# Patient Record
Sex: Female | Born: 1975
Health system: Southern US, Community
[De-identification: ages and names within clinical notes are randomized; demographics above are authoritative.]

## PROBLEM LIST (undated history)

## (undated) DIAGNOSIS — L811 Chloasma: Secondary | ICD-10-CM

## (undated) DIAGNOSIS — N3941 Urge incontinence: Secondary | ICD-10-CM

## (undated) DIAGNOSIS — Z803 Family history of malignant neoplasm of breast: Secondary | ICD-10-CM

## (undated) DIAGNOSIS — Z8041 Family history of malignant neoplasm of ovary: Secondary | ICD-10-CM

## (undated) DIAGNOSIS — K219 Gastro-esophageal reflux disease without esophagitis: Secondary | ICD-10-CM

## (undated) DIAGNOSIS — Z1371 Encounter for nonprocreative screening for genetic disease carrier status: Secondary | ICD-10-CM

## (undated) DIAGNOSIS — F329 Major depressive disorder, single episode, unspecified: Secondary | ICD-10-CM

## (undated) DIAGNOSIS — Z9289 Personal history of other medical treatment: Secondary | ICD-10-CM

## (undated) DIAGNOSIS — F32A Depression, unspecified: Secondary | ICD-10-CM

## (undated) HISTORY — DX: Family history of malignant neoplasm of ovary: Z80.41

## (undated) HISTORY — DX: Gastro-esophageal reflux disease without esophagitis: K21.9

## (undated) HISTORY — DX: Personal history of other medical treatment: Z92.89

## (undated) HISTORY — DX: Chloasma: L81.1

## (undated) HISTORY — DX: Urge incontinence: N39.41

## (undated) HISTORY — PX: CHOLECYSTECTOMY: SHX55

## (undated) HISTORY — DX: Depression, unspecified: F32.A

## (undated) HISTORY — DX: Encounter for nonprocreative screening for genetic disease carrier status: Z13.71

## (undated) HISTORY — DX: Family history of malignant neoplasm of breast: Z80.3

## (undated) HISTORY — DX: Major depressive disorder, single episode, unspecified: F32.9

---

## 1999-01-20 ENCOUNTER — Other Ambulatory Visit: Admission: RE | Admit: 1999-01-20 | Discharge: 1999-01-20 | Payer: Self-pay | Admitting: Obstetrics and Gynecology

## 2003-01-20 HISTORY — PX: BREAST BIOPSY: SHX20

## 2003-10-26 ENCOUNTER — Ambulatory Visit: Payer: Self-pay | Admitting: Surgery

## 2004-05-21 ENCOUNTER — Ambulatory Visit: Payer: Self-pay

## 2004-09-01 ENCOUNTER — Ambulatory Visit: Payer: Self-pay | Admitting: Obstetrics and Gynecology

## 2004-09-08 ENCOUNTER — Ambulatory Visit: Payer: Self-pay

## 2005-03-25 ENCOUNTER — Inpatient Hospital Stay: Payer: Self-pay | Admitting: Certified Nurse Midwife

## 2007-06-30 ENCOUNTER — Ambulatory Visit: Payer: Self-pay

## 2007-07-13 ENCOUNTER — Ambulatory Visit: Payer: Self-pay

## 2008-11-05 ENCOUNTER — Ambulatory Visit: Payer: Self-pay

## 2009-12-25 ENCOUNTER — Ambulatory Visit: Payer: Self-pay

## 2010-02-19 ENCOUNTER — Ambulatory Visit: Payer: Self-pay | Admitting: Family Medicine

## 2010-02-20 ENCOUNTER — Ambulatory Visit: Payer: Self-pay | Admitting: Family Medicine

## 2010-03-21 ENCOUNTER — Ambulatory Visit: Payer: Self-pay | Admitting: Surgery

## 2010-03-21 ENCOUNTER — Other Ambulatory Visit: Payer: Self-pay | Admitting: Surgery

## 2010-03-24 LAB — PATHOLOGY REPORT

## 2010-04-14 ENCOUNTER — Ambulatory Visit: Payer: Self-pay | Admitting: Surgery

## 2010-04-29 ENCOUNTER — Ambulatory Visit: Payer: Self-pay | Admitting: Surgery

## 2010-04-30 LAB — PATHOLOGY REPORT

## 2010-08-18 ENCOUNTER — Ambulatory Visit: Payer: Self-pay

## 2010-12-10 ENCOUNTER — Other Ambulatory Visit: Payer: Self-pay | Admitting: Physician Assistant

## 2011-04-24 ENCOUNTER — Ambulatory Visit: Payer: Self-pay

## 2011-05-12 ENCOUNTER — Other Ambulatory Visit: Payer: Self-pay | Admitting: Physician Assistant

## 2011-05-12 ENCOUNTER — Other Ambulatory Visit: Payer: Self-pay | Admitting: Obstetrics and Gynecology

## 2011-05-12 LAB — CBC WITH DIFFERENTIAL/PLATELET
Basophil %: 0.6 %
Eosinophil #: 0 10*3/uL (ref 0.0–0.7)
Eosinophil %: 0.5 %
HCT: 41.2 % (ref 35.0–47.0)
Lymphocyte #: 1.4 10*3/uL (ref 1.0–3.6)
Lymphocyte %: 30.1 %
MCHC: 33.9 g/dL (ref 32.0–36.0)
MCV: 88 fL (ref 80–100)
Monocyte #: 0.3 x10 3/mm (ref 0.2–0.9)
Monocyte %: 6.8 %
Neutrophil #: 2.9 10*3/uL (ref 1.4–6.5)
Neutrophil %: 62 %
Platelet: 206 10*3/uL (ref 150–440)
RBC: 4.7 10*6/uL (ref 3.80–5.20)
WBC: 4.7 10*3/uL (ref 3.6–11.0)

## 2011-05-12 LAB — TSH: Thyroid Stimulating Horm: 0.871 u[IU]/mL

## 2011-05-12 LAB — BASIC METABOLIC PANEL
Anion Gap: 7 (ref 7–16)
EGFR (Non-African Amer.): >60
Osmolality: 278 (ref 275–301)
Potassium: 3.6 mmol/L (ref 3.5–5.1)

## 2015-10-11 DIAGNOSIS — E559 Vitamin D deficiency, unspecified: Secondary | ICD-10-CM | POA: Diagnosis not present

## 2015-10-11 DIAGNOSIS — Z803 Family history of malignant neoplasm of breast: Secondary | ICD-10-CM | POA: Diagnosis not present

## 2015-10-11 DIAGNOSIS — R002 Palpitations: Secondary | ICD-10-CM | POA: Diagnosis not present

## 2015-10-11 DIAGNOSIS — R140 Abdominal distension (gaseous): Secondary | ICD-10-CM | POA: Diagnosis not present

## 2015-10-11 DIAGNOSIS — Z1211 Encounter for screening for malignant neoplasm of colon: Secondary | ICD-10-CM | POA: Diagnosis not present

## 2015-10-11 DIAGNOSIS — Z8041 Family history of malignant neoplasm of ovary: Secondary | ICD-10-CM | POA: Diagnosis not present

## 2015-10-11 DIAGNOSIS — Z01419 Encounter for gynecological examination (general) (routine) without abnormal findings: Secondary | ICD-10-CM | POA: Diagnosis not present

## 2015-10-11 DIAGNOSIS — Z124 Encounter for screening for malignant neoplasm of cervix: Secondary | ICD-10-CM | POA: Diagnosis not present

## 2015-10-15 ENCOUNTER — Other Ambulatory Visit: Payer: Self-pay | Admitting: Certified Nurse Midwife

## 2015-10-15 DIAGNOSIS — Z1231 Encounter for screening mammogram for malignant neoplasm of breast: Secondary | ICD-10-CM

## 2015-10-22 ENCOUNTER — Encounter: Payer: Self-pay | Admitting: Radiology

## 2015-10-22 ENCOUNTER — Ambulatory Visit
Admission: RE | Admit: 2015-10-22 | Discharge: 2015-10-22 | Disposition: A | Discharge status: Home / Self Care | Payer: 59 | Source: Ambulatory Visit | Attending: Certified Nurse Midwife | Admitting: Certified Nurse Midwife

## 2015-10-22 DIAGNOSIS — Z9289 Personal history of other medical treatment: Secondary | ICD-10-CM

## 2015-10-22 DIAGNOSIS — Z1231 Encounter for screening mammogram for malignant neoplasm of breast: Secondary | ICD-10-CM | POA: Diagnosis not present

## 2015-10-22 HISTORY — DX: Personal history of other medical treatment: Z92.89

## 2015-10-24 DIAGNOSIS — M542 Cervicalgia: Secondary | ICD-10-CM | POA: Diagnosis not present

## 2015-10-24 DIAGNOSIS — M546 Pain in thoracic spine: Secondary | ICD-10-CM | POA: Diagnosis not present

## 2015-10-24 DIAGNOSIS — M62830 Muscle spasm of back: Secondary | ICD-10-CM | POA: Diagnosis not present

## 2015-12-05 DIAGNOSIS — H5213 Myopia, bilateral: Secondary | ICD-10-CM | POA: Diagnosis not present

## 2016-10-22 ENCOUNTER — Encounter: Payer: Self-pay | Admitting: Certified Nurse Midwife

## 2016-10-22 ENCOUNTER — Ambulatory Visit (INDEPENDENT_AMBULATORY_CARE_PROVIDER_SITE_OTHER): Payer: 59 | Admitting: Certified Nurse Midwife

## 2016-10-22 VITALS — BP 110/68 | HR 59 | Ht 64.0 in | Wt 149.0 lb

## 2016-10-22 DIAGNOSIS — Z8041 Family history of malignant neoplasm of ovary: Secondary | ICD-10-CM

## 2016-10-22 DIAGNOSIS — Z803 Family history of malignant neoplasm of breast: Secondary | ICD-10-CM | POA: Diagnosis not present

## 2016-10-22 DIAGNOSIS — R61 Generalized hyperhidrosis: Secondary | ICD-10-CM

## 2016-10-22 DIAGNOSIS — R232 Flushing: Secondary | ICD-10-CM | POA: Diagnosis not present

## 2016-10-22 DIAGNOSIS — Z124 Encounter for screening for malignant neoplasm of cervix: Secondary | ICD-10-CM | POA: Diagnosis not present

## 2016-10-22 DIAGNOSIS — Z01419 Encounter for gynecological examination (general) (routine) without abnormal findings: Secondary | ICD-10-CM | POA: Diagnosis not present

## 2016-10-22 DIAGNOSIS — L293 Anogenital pruritus, unspecified: Secondary | ICD-10-CM

## 2016-10-22 DIAGNOSIS — N926 Irregular menstruation, unspecified: Secondary | ICD-10-CM

## 2016-10-22 DIAGNOSIS — Z1231 Encounter for screening mammogram for malignant neoplasm of breast: Secondary | ICD-10-CM

## 2016-10-22 DIAGNOSIS — Z13220 Encounter for screening for lipoid disorders: Secondary | ICD-10-CM

## 2016-10-22 DIAGNOSIS — Z1239 Encounter for other screening for malignant neoplasm of breast: Secondary | ICD-10-CM

## 2016-10-22 MED ORDER — HYDROCORTISONE ACE-PRAMOXINE 2.5-1 % RE CREA: 1.0000 "application " | TOPICAL_CREAM | Freq: Three times a day (TID) | RECTAL | 0 refills | Status: DC

## 2016-10-22 NOTE — Progress Notes (Signed)
Gynecology Annual Exam  PCP: Adron Bene, PA-C  Chief Complaint:  Chief Complaint  Patient presents with  . Gynecologic Exam    History of Present Illness:History Of Present Illness  Ashley Porter is a 41 year old Caucasian/White female , G 2 P 2 0 0 2 , who presents for her annual exam. She is having problems with irregular menses since Spring of this year. Her menses were regular (every 28-30 days) until Spring at which time they started to space out to 40+ days apart and then she had no menses in July and August. In September she had 2 menses 21 days apart.  Her LMP was 10/17/2016 . Her menses last 3 days , are medium flow, and are without clots. She has been having hot flashes and night sweats since summer, but they have decreased since losing weight. She denies dysmenorrhea with menses, but does have some Mittleschmerz, which does not usually require analgesics.  The patient's past medical history is detailed in the past medical history section.  Since her last annual GYN exam dated 10/11/2015, she has also had some problems with vulvovaginal and perirectal irritation. She was treated for a self diagnosed yeast infection in the spring and her vulvovaginal itching has decreased. She has some hemorrhoidal discomfort and has been using Prep H wipes with some relief. She reports recently having an episode of  upper abdominal pain accompanied by diarrhea that resolved after 1-2 days. Was not seen by a provider for evaluation.  She is sexually active. She is currently using a vasectomy for contraception.  Her most recent pap smear was obtained 10/11/2015 and was NIL/ negative HRHPV. Her most recent mammogram obtained on 10/22/2015 was normal and revealed no significant changes.  There is a positive history of breast cancer in her mother. Genetic testing has been done. Her mother and more recently her brother tested positive for BRCA 1 and Soma tested negative for this mutation Her  lifetime risk of breast cancer is 14.7% (TC Model 10/13/2015) There is a family history of ovarian cancer in her maternal great grandmother, a maternal second cousin. and the maternal second cousin's daughter. Genetic testing has been done on the second cousin's daughter which was positive. The patient did not have testing except for the deletion on BRCA1  The patient does do occasional self breast exams.  The patient does not smoke.  The patient does drink occasionally.  The patient does not use illegal drugs.  The patient exercises occasionally.  The patient does not get adequate calcium in her diet.  She had a recent cholesterol screen in 2016 that was normal.    The patient denies current symptoms of depression.    Review of Systems: Review of Systems  Constitutional: Positive for weight loss (intentional). Negative for chills and fever.  HENT: Negative for congestion, sinus pain and sore throat.   Eyes: Negative for blurred vision and pain.  Respiratory: Negative for hemoptysis, shortness of breath and wheezing.   Cardiovascular: Positive for palpitations. Negative for chest pain and leg swelling.  Gastrointestinal: Negative for abdominal pain, blood in stool, diarrhea, heartburn, nausea and vomiting.       Positive for rectal irritation/ hemorrhoids  Genitourinary: Negative for dysuria, frequency, hematuria and urgency.       Positive for irregular menses  Musculoskeletal: Negative for back pain, joint pain and myalgias.  Skin: Negative for itching and rash.  Neurological: Negative for dizziness, tingling and headaches.  Endo/Heme/Allergies: Negative for  environmental allergies and polydipsia. Does not bruise/bleed easily.       Negative for hirsutism; positive for hot flashes and night sweats   Psychiatric/Behavioral: Negative for depression. The patient is nervous/anxious. The patient does not have insomnia.     Past Medical History:  Past Medical History:  Diagnosis Date  .  BRCA negative   . Depression   . Family history of breast cancer    mom is BRCA positive and pt is BRCA negative  . Family history of ovarian cancer   . GERD (gastroesophageal reflux disease)   . History of mammogram 10/22/2015   Birads 1D had 3D mammo  . Melasma   . Urge incontinence     Past Surgical History:  Past Surgical History:  Procedure Laterality Date  . BREAST BIOPSY Right 2005   neg  . CHOLECYSTECTOMY      Family History:  Family History  Problem Relation Age of Onset  . Breast cancer Mother 52       has BRCA1 mutation  . Basal cell carcinoma Mother   . Hypertension Father   . Ovarian cancer Maternal Grandmother 18  . Alzheimer's disease Paternal Grandmother   . Ovarian cancer Cousin 23       maternal second cousin. Maternal second cousin's daughter with ovarian cancer- has BRCA1 mutation    Social History:  Social History   Social History  . Marital status: Married    Spouse name: N/A  . Number of children: 2  . Years of education: N/A   Occupational History  . 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 Smoking status: Never Smoker  . Smokeless tobacco: Never Used  . Alcohol use Yes     Comment: occasional  . Drug use: No  . Sexual activity: Yes    Partners: Male    Birth control/ protection: Other-see comments     Comment: vasectomy   Other Topics Concern  . Not on file   Social History Narrative  . No narrative on file    Allergies:  No Known Allergies  Medications: Current Outpatient Prescriptions:  .  Cholecalciferol (VITAMIN D3) 5000 units TBDP, Take by mouth., Disp: , Rfl:  .  Multiple Vitamin (MULTIVITAMIN) tablet, Take 1 tablet by mouth daily., Disp: , Rfl:  .  Probiotic Product (PROBIOTIC-10 PO), Take by mouth., Disp: , Rfl:  .  hydrocortisone-pramoxine (ANALPRAM HC) 2.5-1 % rectal cream, Place 1 application rectally 3 (three) times daily., Disp: 30 g, Rfl: 0   Physical Exam Vitals: BP 110/68   Pulse (!) 59   Ht  '5\' 4"'  (1.626 m)   Wt 149 lb (67.6 kg)   LMP 10/17/2016 (Exact Date)   BMI 25.58 kg/m   General: WF in  NAD HEENT: normocephalic, anicteric Neck: no thyroid enlargement, no palpable nodules, no cervical lymphadenopathy  Pulmonary: No increased work of breathing, CTAB Cardiovascular: RRR, without murmur  Breast: Breast symmetrical, no tenderness, no palpable nodules or masses, no skin or nipple retraction present, no nipple discharge.  No axillary, infraclavicular or supraclavicular lymphadenopathy. Abdomen: Soft, non-tender, non-distended.  Umbilicus without lesions.  No hepatomegaly or masses palpable. No evidence of hernia. Genitourinary:  External: Normal external female genitalia.  Normal urethral meatus, normal Bartholin's and Skene's glands.  Lower portion of perineum with inflammation and tiny fissures.  Vagina: Normal vaginal mucosa, no evidence of prolapse.    Cervix: Grossly normal in appearance, no bleeding, non-tender  Uterus: Retroflexed, normal size, shape, and  consistency, mobile, and non-tender  Adnexa: No adnexal masses, non-tender  Rectal: inflammation and tiny fissures in perianal mucosa at 12 o'clock; small non inflamed external hemorrhoids  Lymphatic: no evidence of inguinal lymphadenopathy Extremities: no edema, erythema, or tenderness Neurologic: Grossly intact Psychiatric: mood appropriate, affect full     Assessment: 41 y.o. G2 P2 with irregular menses/ hot flashes and night sweats. R/O perimenopausal symptoms R/O thyroid disease  FSH, LH, TSH, CBC, CMP Perirectal irritation and fissures  Trial of Analpram HC 2.5% tid topically Family history of breast and ovarian cancer   Plan:   1) Breast cancer screening - recommend monthly self breast exam and annual mammograms. Mammogram was ordered today. Patient to schedule at [facility]  2) Cervical cancer screening - Pap was done.  3) Routine healthcare maintenance including cholesterol screening ordered  today. Will notify of results.  4) RTO 1 year or sooner prn.   Dalia Heading, CNM

## 2016-10-23 ENCOUNTER — Encounter: Payer: Self-pay | Admitting: Certified Nurse Midwife

## 2016-10-23 DIAGNOSIS — Z8041 Family history of malignant neoplasm of ovary: Secondary | ICD-10-CM | POA: Insufficient documentation

## 2016-10-23 DIAGNOSIS — Z803 Family history of malignant neoplasm of breast: Secondary | ICD-10-CM | POA: Insufficient documentation

## 2016-10-23 LAB — IGP,RFX APTIMA HPV ALL PTH: PAP SMEAR COMMENT: 0

## 2016-10-23 LAB — CBC WITH DIFFERENTIAL/PLATELET
BASOS ABS: 0 10*3/uL (ref 0.0–0.2)
Basos: 0 %
EOS (ABSOLUTE): 0 10*3/uL (ref 0.0–0.4)
Eos: 1 %
HEMOGLOBIN: 14.3 g/dL (ref 11.1–15.9)
Hematocrit: 42 % (ref 34.0–46.6)
Immature Grans (Abs): 0 10*3/uL (ref 0.0–0.1)
Immature Granulocytes: 0 %
LYMPHS ABS: 1.2 10*3/uL (ref 0.7–3.1)
LYMPHS: 29 %
MCH: 30.2 pg (ref 26.6–33.0)
MCHC: 34 g/dL (ref 31.5–35.7)
MCV: 89 fL (ref 79–97)
MONOCYTES: 5 %
MONOS ABS: 0.2 10*3/uL (ref 0.1–0.9)
NEUTROS ABS: 2.7 10*3/uL (ref 1.4–7.0)
NEUTROS PCT: 65 %
PLATELETS: 214 10*3/uL (ref 150–379)
RBC: 4.74 x10E6/uL (ref 3.77–5.28)
RDW: 13.9 % (ref 12.3–15.4)
WBC: 4.2 10*3/uL (ref 3.4–10.8)

## 2016-10-23 LAB — POCT WET PREP (WET MOUNT): TRICHOMONAS WET PREP HPF POC: ABSENT

## 2016-10-23 LAB — COMPREHENSIVE METABOLIC PANEL
ALBUMIN: 4.4 g/dL (ref 3.5–5.5)
ALK PHOS: 55 IU/L (ref 39–117)
ALT: 14 IU/L (ref 0–32)
AST: 19 IU/L (ref 0–40)
Albumin/Globulin Ratio: 2.1 (ref 1.2–2.2)
BILIRUBIN TOTAL: 0.4 mg/dL (ref 0.0–1.2)
BUN / CREAT RATIO: 15 (ref 9–23)
BUN: 13 mg/dL (ref 6–24)
CHLORIDE: 101 mmol/L (ref 96–106)
CO2: 21 mmol/L (ref 20–29)
Calcium: 9.1 mg/dL (ref 8.7–10.2)
Creatinine, Ser: 0.85 mg/dL (ref 0.57–1.00)
GFR calc Af Amer: 99 mL/min/{1.73_m2} (ref >59)
GFR calc non Af Amer: 86 mL/min/{1.73_m2} (ref >59)
GLUCOSE: 79 mg/dL (ref 65–99)
Globulin, Total: 2.1 g/dL (ref 1.5–4.5)
Potassium: 4 mmol/L (ref 3.5–5.2)
SODIUM: 137 mmol/L (ref 134–144)
Total Protein: 6.5 g/dL (ref 6.0–8.5)

## 2016-10-23 LAB — LIPID PANEL
CHOLESTEROL TOTAL: 121 mg/dL (ref 100–199)
Chol/HDL Ratio: 2.4 ratio (ref 0.0–4.4)
HDL: 51 mg/dL (ref >39)
LDL Calculated: 50 mg/dL (ref 0–99)
Triglycerides: 102 mg/dL (ref 0–149)
VLDL CHOLESTEROL CAL: 20 mg/dL (ref 5–40)

## 2016-10-23 LAB — TSH: TSH: 2.04 u[IU]/mL (ref 0.450–4.500)

## 2016-10-23 LAB — FSH/LH
FSH: 13.6 m[IU]/mL
LH: 5.8 m[IU]/mL

## 2016-10-26 ENCOUNTER — Ambulatory Visit: Payer: Self-pay | Admitting: Certified Nurse Midwife

## 2016-10-29 ENCOUNTER — Ambulatory Visit: Payer: Self-pay | Admitting: Certified Nurse Midwife

## 2016-11-12 ENCOUNTER — Ambulatory Visit
Admission: RE | Admit: 2016-11-12 | Discharge: 2016-11-12 | Disposition: A | Discharge status: Home / Self Care | Payer: 59 | Source: Ambulatory Visit | Attending: Certified Nurse Midwife | Admitting: Certified Nurse Midwife

## 2016-11-12 DIAGNOSIS — Z1231 Encounter for screening mammogram for malignant neoplasm of breast: Secondary | ICD-10-CM | POA: Insufficient documentation

## 2016-11-12 DIAGNOSIS — Z1239 Encounter for other screening for malignant neoplasm of breast: Secondary | ICD-10-CM

## 2016-12-15 DIAGNOSIS — H5213 Myopia, bilateral: Secondary | ICD-10-CM | POA: Diagnosis not present

## 2017-06-01 ENCOUNTER — Ambulatory Visit (INDEPENDENT_AMBULATORY_CARE_PROVIDER_SITE_OTHER): Payer: 59 | Admitting: Certified Nurse Midwife

## 2017-06-01 ENCOUNTER — Encounter: Payer: Self-pay | Admitting: Certified Nurse Midwife

## 2017-06-01 VITALS — BP 108/68 | HR 60 | Ht 64.0 in | Wt 131.0 lb

## 2017-06-01 DIAGNOSIS — N6310 Unspecified lump in the right breast, unspecified quadrant: Secondary | ICD-10-CM | POA: Diagnosis not present

## 2017-06-01 NOTE — Progress Notes (Signed)
Obstetrics & Gynecology Office Visit   Chief Complaint:  Chief Complaint  Patient presents with  . Breast Problem    breast exam    History of Present Illness: Ashley Porter is a 42 year old female G2 P2002, LMP 05/27/2017, presents with complaints of a right breast mass that she first palpated 2-3 weeks ago. The mass did not resolve after her menses.  The mass is not tender. Her last mammogram was 11/12/2016 and was negative. There is a positive history of breast cancer in her mother. Genetic testing has been done. Her mother and more recently her brother tested positive for BRCA 1 and Monick tested negative for this mutation Her lifetime risk of breast cancer is 14.7% (TC Model 10/13/2015)   Review of Systems:  ROS -see HPI. ROS also remarkable for a 18# intentional weight loss since her annual exam.  Past Medical History:  Past Medical History:  Diagnosis Date  . BRCA negative   . Depression   . Family history of breast cancer    mom is BRCA positive and pt is BRCA negative  . Family history of ovarian cancer   . GERD (gastroesophageal reflux disease)   . History of mammogram 10/22/2015   Birads 1D had 3D mammo  . Melasma   . Urge incontinence     Past Surgical History:  Past Surgical History:  Procedure Laterality Date  . BREAST BIOPSY Right 2005   neg  . CHOLECYSTECTOMY      Gynecologic History: Patient's last menstrual period was 05/27/2017 (exact date).  Obstetric History: [id number]  Family History:  Family History  Problem Relation Age of Onset  . Breast cancer Mother 42       has BRCA1 mutation  . Basal cell carcinoma Mother   . Hypertension Father   . Ovarian cancer Maternal Grandmother 69  . Alzheimer's disease Paternal Grandmother   . Ovarian cancer Cousin 73       maternal second cousin. Maternal second cousin's daughter with ovarian cancer- has BRCA1 mutation    Social History:  Social History   Socioeconomic History  . Marital status: Married    Spouse name: Not on file  . Number of children: 2  . Years of education: Not on file  . Highest education level: Not on file  Occupational History  . Occupation: RN-NICU at [employer]  . Financial resource strain: Not on file  . Food insecurity:    Worry: Not on file    Inability: Not on file  . Transportation needs:    Medical: Not on file    Non-medical: Not on file  Tobacco Use  . Smoking status: Never Smoker  . Smokeless tobacco: Never Used  Substance and Sexual Activity  . Alcohol use: Yes    Comment: occasional  . Drug use: No  . Sexual activity: Yes    Partners: Male    Birth control/protection: Other-see comments    Comment: vasectomy  Lifestyle  . Physical activity:    Days per week: Not on file    Minutes per session: Not on file  . Stress: Not on file  Relationships  . Social connections:    Talks on phone: Not on file    Gets together: Not on file    Attends religious service: Not on file    Active member of club or organization: Not on file    Attends meetings of clubs or organizations: Not on file    Relationship status:  Not on file  . Intimate partner violence:    Fear of current or ex partner: Not on file    Emotionally abused: Not on file    Physically abused: Not on file    Forced sexual activity: Not on file  Other Topics Concern  . Not on file  Social History Narrative  . Not on file    Allergies:  No Known Allergies  Medications: Prior to Admission medications   Medication Sig Start Date End Date Taking? Authorizing Provider  Cholecalciferol (VITAMIN D3) 5000 units TBDP Take by mouth.   Yes [provider]  Multiple Vitamin (MULTIVITAMIN) tablet Take 1 tablet by mouth daily.   Yes [provider]  Probiotic Product (PROBIOTIC-10 PO) Take by mouth.   Yes [provider]    Physical Exam Vitals:BP 108/68   Pulse 60   Ht '5\' 4"'  (1.626 m)   Wt 131 lb (59.4 kg)   LMP 05/27/2017 (Exact Date)   BMI  22.49 kg/m     Physical Exam  Constitutional: She is oriented to person, place, and time. She appears well-developed and well-nourished. No distress.  Cardiovascular: Normal rate.  Respiratory: Effort normal.    Breasts: symmetrical, no skin or nipple changes.1-2 cm mass at 7-8 o'clock in the right breast, irregular shape, NT, and felt with patient siting up. When lying down there was a soft round mass in the same area.  Left breast: no masses, NT  No axillary LAN   Musculoskeletal: Normal range of motion.  Neurological: She is alert and oriented to person, place, and time.  Skin: Skin is warm and dry. No rash noted. No erythema.  Psychiatric: She has a normal mood and affect.     Assessment: 42 y.o. [id number] with right breast mas Plan: Problem List Items Addressed This Visit    None    Visit Diagnoses    Breast mass, right    -  Primary   Relevant Orders   MM DIAG BREAST TOMO UNI RIGHT   US BREAST LTD UNI RIGHT INC AXILLA     Will follow up after diagnostic mammogram on RT and RT breast ultrasound  Dalia Heading, CNM

## 2017-06-10 ENCOUNTER — Ambulatory Visit
Admission: RE | Admit: 2017-06-10 | Discharge: 2017-06-10 | Disposition: A | Discharge status: Home / Self Care | Payer: 59 | Source: Ambulatory Visit | Attending: Certified Nurse Midwife | Admitting: Certified Nurse Midwife

## 2017-06-10 ENCOUNTER — Other Ambulatory Visit: Payer: 59

## 2017-06-10 ENCOUNTER — Other Ambulatory Visit: Payer: Self-pay | Admitting: Certified Nurse Midwife

## 2017-06-10 ENCOUNTER — Telehealth: Payer: Self-pay | Admitting: Certified Nurse Midwife

## 2017-06-10 DIAGNOSIS — N6314 Unspecified lump in the right breast, lower inner quadrant: Secondary | ICD-10-CM | POA: Diagnosis not present

## 2017-06-10 DIAGNOSIS — N6310 Unspecified lump in the right breast, unspecified quadrant: Secondary | ICD-10-CM | POA: Diagnosis not present

## 2017-06-10 DIAGNOSIS — R928 Other abnormal and inconclusive findings on diagnostic imaging of breast: Secondary | ICD-10-CM

## 2017-06-10 DIAGNOSIS — R922 Inconclusive mammogram: Secondary | ICD-10-CM | POA: Diagnosis not present

## 2017-06-10 DIAGNOSIS — N6313 Unspecified lump in the right breast, lower outer quadrant: Secondary | ICD-10-CM | POA: Diagnosis not present

## 2017-06-10 NOTE — Telephone Encounter (Signed)
Patient called to discuss diagnostic mammogram and ultrasound results from today. There was a pea sized palpable mass in the right breast at 6 o'clock. On ultrasound there is a 0.9 x 0.4 x 0.9 cm hypoechoic circumscribed slightly hypervascular mass in the area where the mass was palpated. The radiologist recommends an ultrasound guided needle biopsy. Ashley Porter would like to have this done at [facility] imaging by Dr Azucena Kuba. Will send consult. Farrel Conners, CNM

## 2017-06-11 ENCOUNTER — Telehealth: Payer: Self-pay | Admitting: Certified Nurse Midwife

## 2017-06-11 NOTE — Telephone Encounter (Signed)
Patient is aware of appointment w/ Dr. Azucena Kuba at [facility] on Tuesday, 06/22/17 @ 12:45pm. Patient is aware to arrive @ 12:30pm at [address] 4th floor, and to avoid wearing lotions, powders, or deoderants. Patient was offered [facility] first available appointment on 06/16/17 @ 1:30pm w/ Dr. Marisa Sprinkles, but decided to wait to see Dr. Azucena Kuba on 06/22/17. Patient has my phone# and ext.

## 2017-06-11 NOTE — Telephone Encounter (Signed)
Thank you Nancy

## 2017-06-11 NOTE — Telephone Encounter (Signed)
-----   Message from Farrel Conners, [place] sent at 06/10/2017  8:53 PM EDT ----- Regarding: breast biopsy scheduling Patient had a Birads 4 mammogram/ultrasound on 5/23 and a ultrasound guided biopsy is recommended (mass is palpable). Desires this at [facility] imaging with Dr Azucena Kuba, instead of at [facility]. Her mother had breast cancer and this is where she has had her biopsies done. Was not sure about the order. Let me now if I need to change it. She would like to have it done next week, except for Monday. Thanks, [employer]

## 2017-06-19 HISTORY — PX: BREAST BIOPSY: SHX20

## 2017-06-22 ENCOUNTER — Ambulatory Visit
Admission: RE | Admit: 2017-06-22 | Discharge: 2017-06-22 | Disposition: A | Discharge status: Home / Self Care | Payer: 59 | Source: Ambulatory Visit | Attending: Certified Nurse Midwife | Admitting: Certified Nurse Midwife

## 2017-06-22 ENCOUNTER — Other Ambulatory Visit: Payer: Self-pay | Admitting: Certified Nurse Midwife

## 2017-06-22 DIAGNOSIS — R928 Other abnormal and inconclusive findings on diagnostic imaging of breast: Secondary | ICD-10-CM

## 2017-06-22 DIAGNOSIS — N6011 Diffuse cystic mastopathy of right breast: Secondary | ICD-10-CM | POA: Diagnosis not present

## 2017-06-22 DIAGNOSIS — N6314 Unspecified lump in the right breast, lower inner quadrant: Secondary | ICD-10-CM | POA: Diagnosis not present

## 2017-06-22 DIAGNOSIS — N6310 Unspecified lump in the right breast, unspecified quadrant: Secondary | ICD-10-CM

## 2017-06-22 DIAGNOSIS — N6313 Unspecified lump in the right breast, lower outer quadrant: Secondary | ICD-10-CM | POA: Diagnosis not present

## 2017-07-09 ENCOUNTER — Encounter: Payer: Self-pay | Admitting: Certified Nurse Midwife

## 2017-10-14 ENCOUNTER — Other Ambulatory Visit: Payer: Self-pay | Admitting: Certified Nurse Midwife

## 2017-10-14 DIAGNOSIS — Z1231 Encounter for screening mammogram for malignant neoplasm of breast: Secondary | ICD-10-CM

## 2017-11-04 NOTE — Progress Notes (Addendum)
Gynecology Annual Exam  PCP: Adron Bene, PA-C  Chief Complaint:  Chief Complaint  Patient presents with  . Gynecologic Exam    needs refill of valtrex; flu shot    History of Present Illness:History Of Present Illness  Ashley Porter is a 42 year old Caucasian/White female , G 2 P 2 0 0 2 , who presents for her annual exam. She is concerned about her lack of energy/ fatigue. She is working part time night shift. Does sleep about 8 hours a night when not working. Denies snoring or sleep apnea.  Her menses have returned to being monthly (every 28-32 days) after losing 11#. They last 3 days with light to moderate flow and are without clots..  Her LMP was 10/19/2017 . She has not been bothered by hot flashes since her last exam.  She denies dysmenorrhea with menses, but does have some Mittleschmerz, which does not usually require analgesics. She also experiences palpitations premenstrually. The patient's past medical history is detailed in the past medical history section.  Since her last annual GYN exam dated 10/22/2016, she had a breast biopsy of a right breast mass which was benign.  She has some hemorrhoidal discomfort and has been using some topical herbal product which has helped.  She is sexually active. She is currently using a vasectomy for contraception.  Her most recent pap smear was obtained 10/22/2016 and was NIL. Her most recent bilateral  mammogram obtained on 11/12/16 was normal and revealed no significant changes.  06/10/17 she had a diagnostic mammogram and ultrasound for the right breast mass which was Birads 4, but the breast biopsy was benign. There is a positive history of breast cancer in her mother. Genetic testing has been done. Her mother and more recently her brother tested positive for BRCA 1 and Verle tested negative for this mutation Her lifetime risk of breast cancer is 14.7% (TC Model 10/13/2015)  There is a family history of ovarian cancer in her maternal  great grandmother, a maternal second cousin. and the maternal second cousin's daughter. Genetic testing has been done on the second cousin's daughter which was positive. The patient did not have testing except for the deletion on BRCA1   The patient does do self breast exams. Feels some lumpiness in the right breast that comes and goes cyclically with her menses. The patient does not smoke.  The patient does drink occasionally.  The patient does not use illegal drugs.  The patient exercises occasionally.  The patient does not get adequate calcium in her diet.  She had a recent cholesterol screen in 2018 that was normal.    The patient denies current symptoms of depression.    Review of Systems: Review of Systems  Constitutional: Positive for weight loss (intentional). Negative for chills and fever.  HENT: Negative for congestion, sinus pain and sore throat.   Eyes: Negative for blurred vision and pain.  Respiratory: Negative for hemoptysis, shortness of breath and wheezing.   Cardiovascular: Positive for palpitations. Negative for chest pain and leg swelling.  Gastrointestinal: Negative for abdominal pain, blood in stool, diarrhea, heartburn, nausea and vomiting.       Positive for rectal irritation/ hemorrhoids  Genitourinary: Negative for dysuria, frequency, hematuria and urgency.  Musculoskeletal: Negative for back pain, joint pain and myalgias.  Skin: Negative for itching and rash.  Neurological: Negative for dizziness, tingling and headaches.  Endo/Heme/Allergies: Negative for environmental allergies and polydipsia. Does not bruise/bleed easily.  Psychiatric/Behavioral: Negative for depression. The patient is nervous/anxious. The patient does not have insomnia.   Breast: lumpiness that comes and goes in right breast.  Past Medical History:  Past Medical History:  Diagnosis Date  . BRCA negative   . Depression   . Family history of breast cancer    mom is BRCA positive  and pt is BRCA negative  . Family history of ovarian cancer   . GERD (gastroesophageal reflux disease)   . History of mammogram 10/22/2015   Birads 1D had 3D mammo  . Melasma   . Urge incontinence     Past Surgical History:  Past Surgical History:  Procedure Laterality Date  . BREAST BIOPSY Right 2005   neg  . BREAST BIOPSY Right 06/2017   Benign fibrocystic changes  . CHOLECYSTECTOMY      Family History:  Family History  Problem Relation Age of Onset  . Breast cancer Mother 64       has BRCA1 mutation  . Basal cell carcinoma Mother   . Hypertension Father   . Ovarian cancer Maternal Grandmother 31  . Alzheimer's disease Paternal Grandmother   . Ovarian cancer Cousin 60       maternal second cousin. Maternal second cousin's daughter with ovarian cancer- has BRCA1 mutation    Social History:  Social History   Socioeconomic History  . Marital status: Married    Spouse name: Not on file  . Number of children: 2  . Years of education: 72  . Highest education level: Not on file  Occupational History  . Occupation: RN-NICU at [employer]  . Financial resource strain: Not on file  . Food insecurity:    Worry: Not on file    Inability: Not on file  . Transportation needs:    Medical: Not on file    Non-medical: Not on file  Tobacco Use  . Smoking status: Never Smoker  . Smokeless tobacco: Never Used  Substance and Sexual Activity  . Alcohol use: Yes    Comment: occasional  . Drug use: No  . Sexual activity: Yes    Partners: Male    Birth control/protection: Other-see comments    Comment: vasectomy  Lifestyle  . Physical activity:    Days per week: Not on file    Minutes per session: Not on file  . Stress: Not on file  Relationships  . Social connections:    Talks on phone: Not on file    Gets together: Not on file    Attends religious service: Not on file    Active member of club or organization: Not on file    Attends meetings of clubs or  organizations: Not on file    Relationship status: Not on file  . Intimate partner violence:    Fear of current or ex partner: Not on file    Emotionally abused: Not on file    Physically abused: Not on file    Forced sexual activity: Not on file  Other Topics Concern  . Not on file  Social History Narrative  . Not on file    Allergies:  No Known Allergies  Medications: Current Outpatient Medications:  .  Cholecalciferol (VITAMIN D3) 5000 units TBDP, Take by mouth., Disp: , Rfl:  .  Multiple Vitamin (MULTIVITAMIN) tablet, Take 1 tablet by mouth daily., Disp: , Rfl:  .  Probiotic Product (PROBIOTIC-10 PO), Take by mouth., Disp: , Rfl:  .  valACYclovir (VALTREX) 1000 MG tablet, Take  2000 mgm every 12 hours x 2 doses prn fever blisters, Disp: 20 tablet, Rfl: 1   Physical Exam Vitals: BP 90/70   Pulse 60   Ht '5\' 4"'  (1.626 m)   Wt 138 lb (62.6 kg)   LMP 10/19/2017 (Exact Date)   BMI 23.69 kg/m   General: WF in  NAD HEENT: normocephalic, anicteric Neck: no thyroid enlargement, no palpable nodules, no cervical lymphadenopathy  Pulmonary: No increased work of breathing, CTAB Cardiovascular: RRR, without murmur  Breast:n no tenderness, no palpable nodules or masses, no skin or nipple retraction present, no nipple discharge. Increased nodularity in upper breasts bilaterally with left>riight No axillary, infraclavicular or supraclavicular lymphadenopathy. Abdomen: Soft, non-tender, non-distended.  Umbilicus without lesions.  No hepatomegaly or masses palpable. No evidence of hernia. Genitourinary:  External: Normal external female genitalia.  Normal urethral meatus, normal Bartholin's and Skene's glands.  Lower portion of perineum with inflammation and tiny fissures.  Vagina: Normal vaginal mucosa, no evidence of prolapse.    Cervix: Grossly normal in appearance, no bleeding, non-tender  Uterus: Retroflexed, normal size, shape, and consistency, mobile, and non-tender  Adnexa: No  adnexal masses, non-tender  Rectal: no masses.   Lymphatic: no evidence of inguinal lymphadenopathy Extremities: no edema, erythema, or tenderness Neurologic: Grossly intact Psychiatric: mood appropriate, affect full     Assessment: 42 y.o. G2 P2 normal annual exam S/P right breast biopsy last year. Family history of breast and ovarian cancer. Fatigue-Cbc was done   Plan:   1) Breast cancer screening - recommend monthly self breast exam and annual mammograms. Mammogram is scheduled for 11/15/2017  2) Cervical cancer screening - Pap was done.  3) Routine healthcare maintenance including cholesterol screening UTD. Desires fecal occult blood testing-given collection kit. Flu vaccine given today.  4) RTO 1 year or sooner prn.   Dalia Heading, CNM

## 2017-11-05 ENCOUNTER — Other Ambulatory Visit (HOSPITAL_COMMUNITY)
Admission: RE | Admit: 2017-11-05 | Discharge: 2017-11-05 | Disposition: A | Discharge status: Home / Self Care | Payer: 59 | Source: Ambulatory Visit | Attending: Obstetrics and Gynecology | Admitting: Obstetrics and Gynecology

## 2017-11-05 ENCOUNTER — Ambulatory Visit (INDEPENDENT_AMBULATORY_CARE_PROVIDER_SITE_OTHER): Payer: 59 | Admitting: Certified Nurse Midwife

## 2017-11-05 ENCOUNTER — Encounter: Payer: Self-pay | Admitting: Certified Nurse Midwife

## 2017-11-05 VITALS — BP 90/70 | HR 60 | Ht 64.0 in | Wt 138.0 lb

## 2017-11-05 DIAGNOSIS — Z01419 Encounter for gynecological examination (general) (routine) without abnormal findings: Secondary | ICD-10-CM | POA: Diagnosis not present

## 2017-11-05 DIAGNOSIS — Z8041 Family history of malignant neoplasm of ovary: Secondary | ICD-10-CM | POA: Diagnosis not present

## 2017-11-05 DIAGNOSIS — Z124 Encounter for screening for malignant neoplasm of cervix: Secondary | ICD-10-CM

## 2017-11-05 DIAGNOSIS — Z01411 Encounter for gynecological examination (general) (routine) with abnormal findings: Secondary | ICD-10-CM

## 2017-11-05 DIAGNOSIS — Z1211 Encounter for screening for malignant neoplasm of colon: Secondary | ICD-10-CM

## 2017-11-05 DIAGNOSIS — R5382 Chronic fatigue, unspecified: Secondary | ICD-10-CM

## 2017-11-05 DIAGNOSIS — Z803 Family history of malignant neoplasm of breast: Secondary | ICD-10-CM | POA: Diagnosis not present

## 2017-11-05 MED ORDER — VALACYCLOVIR HCL 1 G PO TABS: ORAL_TABLET | ORAL | 1 refills | Status: DC

## 2017-11-06 LAB — CBC WITH DIFFERENTIAL/PLATELET
BASOS ABS: 0 10*3/uL (ref 0.0–0.2)
Basos: 1 %
EOS (ABSOLUTE): 0 10*3/uL (ref 0.0–0.4)
Eos: 1 %
Hematocrit: 41.6 % (ref 34.0–46.6)
Hemoglobin: 13.9 g/dL (ref 11.1–15.9)
IMMATURE GRANULOCYTES: 0 %
Immature Grans (Abs): 0 10*3/uL (ref 0.0–0.1)
Lymphocytes Absolute: 1.4 10*3/uL (ref 0.7–3.1)
Lymphs: 34 %
MCH: 30.5 pg (ref 26.6–33.0)
MCHC: 33.4 g/dL (ref 31.5–35.7)
MCV: 91 fL (ref 79–97)
MONOS ABS: 0.4 10*3/uL (ref 0.1–0.9)
Monocytes: 10 %
NEUTROS PCT: 54 %
Neutrophils Absolute: 2.3 10*3/uL (ref 1.4–7.0)
PLATELETS: 191 10*3/uL (ref 150–450)
RBC: 4.55 x10E6/uL (ref 3.77–5.28)
RDW: 12.2 % — ABNORMAL LOW (ref 12.3–15.4)
WBC: 4.2 10*3/uL (ref 3.4–10.8)

## 2017-11-09 LAB — CYTOLOGY - PAP: Diagnosis: NEGATIVE

## 2017-11-15 ENCOUNTER — Ambulatory Visit
Admission: RE | Admit: 2017-11-15 | Discharge: 2017-11-15 | Disposition: A | Discharge status: Home / Self Care | Payer: 59 | Source: Ambulatory Visit | Attending: Certified Nurse Midwife | Admitting: Certified Nurse Midwife

## 2017-11-15 DIAGNOSIS — Z1231 Encounter for screening mammogram for malignant neoplasm of breast: Secondary | ICD-10-CM | POA: Insufficient documentation

## 2017-11-16 ENCOUNTER — Other Ambulatory Visit: Payer: Self-pay | Admitting: Certified Nurse Midwife

## 2017-11-16 DIAGNOSIS — N6489 Other specified disorders of breast: Secondary | ICD-10-CM

## 2017-11-16 DIAGNOSIS — R928 Other abnormal and inconclusive findings on diagnostic imaging of breast: Secondary | ICD-10-CM

## 2017-11-25 ENCOUNTER — Ambulatory Visit
Admission: RE | Admit: 2017-11-25 | Discharge: 2017-11-25 | Disposition: A | Discharge status: Home / Self Care | Payer: 59 | Source: Ambulatory Visit | Attending: Certified Nurse Midwife | Admitting: Certified Nurse Midwife

## 2017-11-25 DIAGNOSIS — N6489 Other specified disorders of breast: Secondary | ICD-10-CM | POA: Insufficient documentation

## 2017-11-25 DIAGNOSIS — R928 Other abnormal and inconclusive findings on diagnostic imaging of breast: Secondary | ICD-10-CM | POA: Insufficient documentation

## 2017-11-25 DIAGNOSIS — R922 Inconclusive mammogram: Secondary | ICD-10-CM | POA: Diagnosis not present

## 2017-11-25 DIAGNOSIS — N6002 Solitary cyst of left breast: Secondary | ICD-10-CM | POA: Diagnosis not present

## 2017-12-20 DIAGNOSIS — H5213 Myopia, bilateral: Secondary | ICD-10-CM | POA: Diagnosis not present

## 2018-02-23 DIAGNOSIS — Z1211 Encounter for screening for malignant neoplasm of colon: Secondary | ICD-10-CM | POA: Diagnosis not present

## 2018-10-31 ENCOUNTER — Telehealth: Payer: Self-pay | Admitting: Certified Nurse Midwife

## 2018-10-31 NOTE — Telephone Encounter (Signed)
Patient is schedule for 11/11/18 for annual. Patient is requesting to be able to schedule her mammogram. Please advise. Thank you

## 2018-11-01 ENCOUNTER — Other Ambulatory Visit: Payer: Self-pay | Admitting: Certified Nurse Midwife

## 2018-11-01 DIAGNOSIS — Z1231 Encounter for screening mammogram for malignant neoplasm of breast: Secondary | ICD-10-CM

## 2018-11-01 NOTE — Telephone Encounter (Signed)
Order for screening mammogram placed. Patient called.

## 2018-11-07 ENCOUNTER — Other Ambulatory Visit: Payer: Self-pay | Admitting: Obstetrics and Gynecology

## 2018-11-07 DIAGNOSIS — Z0000 Encounter for general adult medical examination without abnormal findings: Secondary | ICD-10-CM

## 2018-11-07 DIAGNOSIS — Z13220 Encounter for screening for lipoid disorders: Secondary | ICD-10-CM

## 2018-11-07 DIAGNOSIS — Z1329 Encounter for screening for other suspected endocrine disorder: Secondary | ICD-10-CM

## 2018-11-07 DIAGNOSIS — N914 Secondary oligomenorrhea: Secondary | ICD-10-CM

## 2018-11-08 ENCOUNTER — Other Ambulatory Visit: Payer: Self-pay | Admitting: Obstetrics and Gynecology

## 2018-11-08 ENCOUNTER — Ambulatory Visit: Payer: 59 | Admitting: Certified Nurse Midwife

## 2018-11-08 DIAGNOSIS — Z13220 Encounter for screening for lipoid disorders: Secondary | ICD-10-CM | POA: Diagnosis not present

## 2018-11-08 DIAGNOSIS — N914 Secondary oligomenorrhea: Secondary | ICD-10-CM | POA: Diagnosis not present

## 2018-11-08 DIAGNOSIS — Z0000 Encounter for general adult medical examination without abnormal findings: Secondary | ICD-10-CM | POA: Diagnosis not present

## 2018-11-08 DIAGNOSIS — Z1329 Encounter for screening for other suspected endocrine disorder: Secondary | ICD-10-CM | POA: Diagnosis not present

## 2018-11-09 LAB — COMPREHENSIVE METABOLIC PANEL
ALT: 12 IU/L (ref 0–32)
AST: 26 IU/L (ref 0–40)
Albumin/Globulin Ratio: 1.8 (ref 1.2–2.2)
Albumin: 4.5 g/dL (ref 3.8–4.8)
Alkaline Phosphatase: 64 IU/L (ref 39–117)
BUN/Creatinine Ratio: 14 (ref 9–23)
BUN: 14 mg/dL (ref 6–24)
Bilirubin Total: 0.7 mg/dL (ref 0.0–1.2)
CO2: 24 mmol/L (ref 20–29)
Calcium: 9.6 mg/dL (ref 8.7–10.2)
Chloride: 104 mmol/L (ref 96–106)
Creatinine, Ser: 1 mg/dL (ref 0.57–1.00)
GFR calc Af Amer: 80 mL/min/{1.73_m2} (ref >59)
GFR calc non Af Amer: 70 mL/min/{1.73_m2} (ref >59)
Globulin, Total: 2.5 g/dL (ref 1.5–4.5)
Glucose: 86 mg/dL (ref 65–99)
Potassium: 4.3 mmol/L (ref 3.5–5.2)
Sodium: 141 mmol/L (ref 134–144)
Total Protein: 7 g/dL (ref 6.0–8.5)

## 2018-11-09 LAB — ESTRADIOL: Estradiol: 32.6 pg/mL

## 2018-11-09 LAB — CBC
Hematocrit: 41.8 % (ref 34.0–46.6)
Hemoglobin: 14.4 g/dL (ref 11.1–15.9)
MCH: 31 pg (ref 26.6–33.0)
MCHC: 34.4 g/dL (ref 31.5–35.7)
MCV: 90 fL (ref 79–97)
Platelets: 227 10*3/uL (ref 150–450)
RBC: 4.65 x10E6/uL (ref 3.77–5.28)
RDW: 12.4 % (ref 11.7–15.4)
WBC: 7.2 10*3/uL (ref 3.4–10.8)

## 2018-11-09 LAB — LIPID PANEL WITH LDL/HDL RATIO
Cholesterol, Total: 134 mg/dL (ref 100–199)
HDL: 79 mg/dL (ref >39)
LDL Chol Calc (NIH): 40 mg/dL (ref 0–99)
LDL/HDL Ratio: 0.5 ratio (ref 0.0–3.2)
Triglycerides: 76 mg/dL (ref 0–149)
VLDL Cholesterol Cal: 15 mg/dL (ref 5–40)

## 2018-11-09 LAB — LUTEINIZING HORMONE: LH: 11.3 m[IU]/mL

## 2018-11-09 LAB — PROLACTIN: Prolactin: 18.8 ng/mL (ref 4.8–23.3)

## 2018-11-09 LAB — TSH: TSH: 1.8 u[IU]/mL (ref 0.450–4.500)

## 2018-11-09 LAB — FOLLICLE STIMULATING HORMONE: FSH: 13.3 m[IU]/mL

## 2018-11-10 NOTE — Progress Notes (Signed)
Here are the labs for the patient you're seeing tomorrow.Ashley KitchenMarland KitchenMarland Porter

## 2018-11-10 NOTE — Progress Notes (Signed)
Gynecology Annual Exam  PCP: Adron Bene, PA-C  Chief Complaint:  Chief Complaint  Patient presents with  . Gynecologic Exam    Prev discussion re hormones    History of Present Illness:History Of Present Illness  Ashley Porter is a 43 year old Caucasian/White female , G 2 P 2 0 0 2 , who presents for her annual exam. She is concerned about her irregular menses and hot flashes and sleeping difficulties. She has been taking some herbs like tart cherry and black cohash for her symptoms.. She had some labs drawn prior to her appointment today. Of note, her LH=11.3, FSH=13.3, estradiol=32.6, and normal lipid panel, hemoglobin A1C, prolactin, TSH, and CBC.  In the last year, her menses have been irregular (every 28-47 days) and her last 2 menses were 86 days apart. They last 2 days with light to moderate flow and are without clots..  Her LMP was 11/08/2018 . She denies dysmenorrhea.   The patient's past medical history is detailed in the past medical history section.  Since her last annual GYN exam dated 11/05/2017, she is continuing to have problems with SUI. Needs to wear pads when jumping, exercising. She is sexually active. She is currently using a vasectomy for contraception.  Her most recent pap smear was obtained 11/05/2017 and was NIL. Her most recent bilateral  mammogram obtained on 11/15/2017 was Birads 2 after additional views and ultrasound on the left breast. She had a benign breast biopsy of a right breast mass in 2005 and 06/2017.  There is a positive history of breast cancer in her mother. Genetic testing has been done. Her mother and more recently her brother tested positive for BRCA 1 and [place] tested negative for this mutation Her lifetime risk of breast cancer is 14.7% (TC Model 10/13/2015)  There is a family history of ovarian cancer in her maternal great grandmother, a maternal second cousin. and the maternal second cousin's daughter. Genetic testing has been  done on the second cousin's daughter which was positive. The patient did not have testing except for the deletion on BRCA1   The patient does do self breast exams.  The patient does not smoke.  The patient does drink a glass of wine 4-5 times/week.  The patient does not use illegal drugs.  The patient exercises 4-5 times/week (cardio and strength training)  The patient does not get adequate calcium in her diet.  She had a recent cholesterol screen in 2020 that was normal.    The patient denies current symptoms of depression.    Review of Systems: Review of Systems  Constitutional: Negative for chills, fever and weight loss.  HENT: Negative for congestion, sinus pain and sore throat.   Eyes: Negative for blurred vision and pain.  Respiratory: Negative for hemoptysis, shortness of breath and wheezing.   Cardiovascular: Negative for chest pain, palpitations and leg swelling.  Gastrointestinal: Negative for abdominal pain, blood in stool, diarrhea, heartburn, nausea and vomiting.       Positive for rectal irritation/ hemorrhoids  Genitourinary: Negative for dysuria, frequency, hematuria and urgency.       Positive for SUI  Musculoskeletal: Negative for back pain, joint pain and myalgias.  Skin: Negative for itching and rash.  Neurological: Negative for dizziness, tingling and headaches.  Endo/Heme/Allergies: Negative for environmental allergies and polydipsia. Does not bruise/bleed easily.        Positive for hot flashes.  Psychiatric/Behavioral: Negative for depression. The patient is nervous/anxious and has  insomnia.   Breast: lumpiness that comes and goes in right breast.  Past Medical History:  Past Medical History:  Diagnosis Date  . BRCA negative   . Depression   . Family history of breast cancer    mom is BRCA positive and pt is BRCA negative  . Family history of ovarian cancer   . GERD (gastroesophageal reflux disease)   . History of mammogram 10/22/2015   Birads 1D had  3D mammo  . Melasma   . Urge incontinence     Past Surgical History:  Past Surgical History:  Procedure Laterality Date  . BREAST BIOPSY Right 2005   neg  . BREAST BIOPSY Right 06/2017   Benign fibrocystic changes- PASH  . CHOLECYSTECTOMY      Family History:  Family History  Problem Relation Age of Onset  . Breast cancer Mother 28       has BRCA1 mutation  . Basal cell carcinoma Mother   . Hypertension Father   . Ovarian cancer Maternal Grandmother 31  . Alzheimer's disease Paternal Grandmother   . Ovarian cancer Cousin 13       maternal second cousin. Maternal second cousin's daughter with ovarian cancer- has BRCA1 mutation    Social History:  Social History   Socioeconomic History  . Marital status: Married    Spouse name: Not on file  . Number of children: 2  . Years of education: 7  . Highest education level: Not on file  Occupational History  . Occupation: RN-NICU at [employer]  . Financial resource strain: Not on file  . Food insecurity    Worry: Not on file    Inability: Not on file  . Transportation needs    Medical: Not on file    Non-medical: Not on file  Tobacco Use  . Smoking status: Never Smoker  . Smokeless tobacco: Never Used  Substance and Sexual Activity  . Alcohol use: Yes    Comment: occasional  . Drug use: No  . Sexual activity: Yes    Partners: Male    Birth control/protection: Other-see comments    Comment: vasectomy  Lifestyle  . Physical activity    Days per week: Not on file    Minutes per session: Not on file  . Stress: Not on file  Relationships  . Social Herbalist on phone: Not on file    Gets together: Not on file    Attends religious service: Not on file    Active member of club or organization: Not on file    Attends meetings of clubs or organizations: Not on file    Relationship status: Not on file  . Intimate partner violence    Fear of current or ex partner: Not on file    Emotionally  abused: Not on file    Physically abused: Not on file    Forced sexual activity: Not on file  Other Topics Concern  . Not on file  Social History Narrative  . Not on file    Allergies:  No Known Allergies  Medications: Current Outpatient Medications:  .  Cholecalciferol (VITAMIN D3) 5000 units TBDP, Take by mouth., Disp: , Rfl:  .  Multiple Vitamin (MULTIVITAMIN) tablet, Take 1 tablet by mouth daily., Disp: , Rfl:  .  Probiotic Product (PROBIOTIC-10 PO), Take by mouth., Disp: , Rfl:  .  valACYclovir (VALTREX) 1000 MG tablet, Take 2000 mgm every 12 hours x 2 doses prn fever blisters, Disp:  20 tablet, Rfl: 1  Also black cohash, and tart cherry, and occasionally Zantac.  Physical Exam Vitals: BP 110/70   Pulse 82   Ht '5\' 4"'  (1.626 m)   Wt 141 lb (64 kg)   LMP 11/08/2018 (Exact Date)   BMI 24.20 kg/m   General: WF in  NAD HEENT: normocephalic, anicteric Neck: no thyroid enlargement, no palpable nodules, no cervical lymphadenopathy  Pulmonary: No increased work of breathing, CTAB Cardiovascular: RRR, without murmur  Breast:n no tenderness, no palpable nodules or masses, no skin or nipple retraction present, no nipple discharge. Increased nodularity in upper breasts bilaterally with left>riight No axillary, infraclavicular or supraclavicular lymphadenopathy. Abdomen: Soft, non-tender, non-distended.  Umbilicus without lesions.  No hepatomegaly or masses palpable. No evidence of hernia. Genitourinary:  External: Normal external female genitalia.  Normal urethral meatus, normal Bartholin's and Skene's glands.  Vagina: Normal vaginal mucosa, ?urethrocele and rectocele present    Cervix: Grossly normal in appearance, scant bleeding, non-tender  Uterus: Retroflexed, normal size, shape, and consistency, mobile, and non-tender  Adnexa: No adnexal masses, non-tender  Rectal: no masses.   Lymphatic: no evidence of inguinal lymphadenopathy Extremities: no edema, erythema, or tenderness  Neurologic: Grossly intact Psychiatric: mood appropriate, affect full     Assessment: 43 y.o. G2 P2 normal annual exam Vasomotor symptoms and irregular menses-consistent with perimenopause Family history of breast and ovarian cancer Pelvic relaxation with SUI. Has been doing Kegel exercises. Offered PT for pelvic floor training or referral to urogynecology for evaluation regarding surgical treatment options. She will let me know if she desires referral   Plan:   1) Breast cancer screening - recommend monthly self breast exam and annual mammograms. Mammogram is scheduled for 12/28/2018 at [facility]  2) Cervical cancer screening - Pap was done.  3) Routine healthcare maintenance including cholesterol screening UTD. Flu vaccine given today.  4) Discussed hormonal and non hormonal treatment of vasomotor symptoms. She wants to continue her herbal products for now.  4) RTO 1 year or sooner prn.   Dalia Heading, CNM

## 2018-11-11 ENCOUNTER — Encounter: Payer: Self-pay | Admitting: Certified Nurse Midwife

## 2018-11-11 ENCOUNTER — Other Ambulatory Visit (HOSPITAL_COMMUNITY)
Admission: RE | Admit: 2018-11-11 | Discharge: 2018-11-11 | Disposition: A | Discharge status: Home / Self Care | Payer: 59 | Source: Ambulatory Visit | Attending: Certified Nurse Midwife | Admitting: Certified Nurse Midwife

## 2018-11-11 ENCOUNTER — Other Ambulatory Visit: Payer: Self-pay | Admitting: Certified Nurse Midwife

## 2018-11-11 ENCOUNTER — Other Ambulatory Visit: Payer: Self-pay

## 2018-11-11 ENCOUNTER — Ambulatory Visit (INDEPENDENT_AMBULATORY_CARE_PROVIDER_SITE_OTHER): Payer: 59 | Admitting: Certified Nurse Midwife

## 2018-11-11 VITALS — BP 110/70 | HR 82 | Ht 64.0 in | Wt 141.0 lb

## 2018-11-11 DIAGNOSIS — Z124 Encounter for screening for malignant neoplasm of cervix: Secondary | ICD-10-CM | POA: Insufficient documentation

## 2018-11-11 DIAGNOSIS — Z23 Encounter for immunization: Secondary | ICD-10-CM | POA: Diagnosis not present

## 2018-11-11 DIAGNOSIS — N393 Stress incontinence (female) (male): Secondary | ICD-10-CM

## 2018-11-11 DIAGNOSIS — N951 Menopausal and female climacteric states: Secondary | ICD-10-CM

## 2018-11-11 DIAGNOSIS — Z01419 Encounter for gynecological examination (general) (routine) without abnormal findings: Secondary | ICD-10-CM

## 2018-11-11 DIAGNOSIS — N926 Irregular menstruation, unspecified: Secondary | ICD-10-CM

## 2018-11-11 DIAGNOSIS — Z1231 Encounter for screening mammogram for malignant neoplasm of breast: Secondary | ICD-10-CM

## 2018-11-15 LAB — CYTOLOGY - PAP
Comment: NEGATIVE
Diagnosis: NEGATIVE
High risk HPV: NEGATIVE

## 2018-12-28 ENCOUNTER — Other Ambulatory Visit: Payer: Self-pay

## 2018-12-28 ENCOUNTER — Ambulatory Visit
Admission: RE | Admit: 2018-12-28 | Discharge: 2018-12-28 | Disposition: A | Discharge status: Home / Self Care | Payer: 59 | Source: Ambulatory Visit | Attending: Certified Nurse Midwife | Admitting: Certified Nurse Midwife

## 2018-12-28 DIAGNOSIS — Z1231 Encounter for screening mammogram for malignant neoplasm of breast: Secondary | ICD-10-CM

## 2018-12-29 ENCOUNTER — Other Ambulatory Visit: Payer: Self-pay | Admitting: Certified Nurse Midwife

## 2018-12-29 DIAGNOSIS — R928 Other abnormal and inconclusive findings on diagnostic imaging of breast: Secondary | ICD-10-CM

## 2019-01-09 DIAGNOSIS — H5213 Myopia, bilateral: Secondary | ICD-10-CM | POA: Diagnosis not present

## 2019-01-23 ENCOUNTER — Ambulatory Visit
Admission: RE | Admit: 2019-01-23 | Discharge: 2019-01-23 | Disposition: A | Discharge status: Home / Self Care | Payer: 59 | Source: Ambulatory Visit | Attending: Certified Nurse Midwife | Admitting: Certified Nurse Midwife

## 2019-01-23 ENCOUNTER — Other Ambulatory Visit: Payer: Self-pay

## 2019-01-23 DIAGNOSIS — R928 Other abnormal and inconclusive findings on diagnostic imaging of breast: Secondary | ICD-10-CM

## 2019-01-23 DIAGNOSIS — R922 Inconclusive mammogram: Secondary | ICD-10-CM | POA: Diagnosis not present

## 2019-01-23 DIAGNOSIS — N6012 Diffuse cystic mastopathy of left breast: Secondary | ICD-10-CM | POA: Diagnosis not present

## 2019-07-04 ENCOUNTER — Other Ambulatory Visit: Payer: Self-pay | Admitting: Certified Nurse Midwife

## 2019-07-04 MED ORDER — NITROFURANTOIN MONOHYD MACRO 100 MG PO CAPS: 100.0000 mg | ORAL_CAPSULE | Freq: Two times a day (BID) | ORAL | 0 refills | Status: DC

## 2019-07-04 MED ORDER — PHENAZOPYRIDINE HCL 200 MG PO TABS: 200.0000 mg | ORAL_TABLET | Freq: Three times a day (TID) | ORAL | 0 refills | Status: DC | PRN

## 2019-10-01 DIAGNOSIS — Z20828 Contact with and (suspected) exposure to other viral communicable diseases: Secondary | ICD-10-CM | POA: Diagnosis not present

## 2019-10-01 DIAGNOSIS — Z1159 Encounter for screening for other viral diseases: Secondary | ICD-10-CM | POA: Diagnosis not present

## 2019-10-12 ENCOUNTER — Other Ambulatory Visit: Payer: Self-pay | Admitting: Physician Assistant

## 2019-10-12 DIAGNOSIS — Z1231 Encounter for screening mammogram for malignant neoplasm of breast: Secondary | ICD-10-CM

## 2019-10-31 ENCOUNTER — Other Ambulatory Visit: Payer: Self-pay | Admitting: Podiatry

## 2019-10-31 DIAGNOSIS — L608 Other nail disorders: Secondary | ICD-10-CM | POA: Diagnosis not present

## 2019-10-31 DIAGNOSIS — M2011 Hallux valgus (acquired), right foot: Secondary | ICD-10-CM | POA: Diagnosis not present

## 2019-10-31 DIAGNOSIS — B351 Tinea unguium: Secondary | ICD-10-CM | POA: Diagnosis not present

## 2019-10-31 DIAGNOSIS — M65872 Other synovitis and tenosynovitis, left ankle and foot: Secondary | ICD-10-CM | POA: Diagnosis not present

## 2019-10-31 DIAGNOSIS — M79672 Pain in left foot: Secondary | ICD-10-CM | POA: Diagnosis not present

## 2019-11-13 ENCOUNTER — Ambulatory Visit: Payer: 59 | Admitting: Physician Assistant

## 2019-11-29 DIAGNOSIS — M79672 Pain in left foot: Secondary | ICD-10-CM | POA: Diagnosis not present

## 2019-11-29 DIAGNOSIS — M65872 Other synovitis and tenosynovitis, left ankle and foot: Secondary | ICD-10-CM | POA: Diagnosis not present

## 2019-12-29 ENCOUNTER — Other Ambulatory Visit: Payer: Self-pay

## 2019-12-29 ENCOUNTER — Ambulatory Visit
Admission: RE | Admit: 2019-12-29 | Discharge: 2019-12-29 | Disposition: A | Discharge status: Home / Self Care | Payer: 59 | Source: Ambulatory Visit | Attending: Physician Assistant | Admitting: Physician Assistant

## 2019-12-29 DIAGNOSIS — Z1231 Encounter for screening mammogram for malignant neoplasm of breast: Secondary | ICD-10-CM | POA: Diagnosis not present

## 2020-01-02 ENCOUNTER — Ambulatory Visit (INDEPENDENT_AMBULATORY_CARE_PROVIDER_SITE_OTHER): Payer: 59 | Admitting: Physician Assistant

## 2020-01-02 ENCOUNTER — Other Ambulatory Visit: Payer: Self-pay

## 2020-01-02 ENCOUNTER — Encounter: Payer: Self-pay | Admitting: Physician Assistant

## 2020-01-02 VITALS — BP 118/76 | HR 64 | Temp 97.7°F | Ht 64.0 in | Wt 146.6 lb

## 2020-01-02 DIAGNOSIS — Z0000 Encounter for general adult medical examination without abnormal findings: Secondary | ICD-10-CM | POA: Diagnosis not present

## 2020-01-02 DIAGNOSIS — N959 Unspecified menopausal and perimenopausal disorder: Secondary | ICD-10-CM

## 2020-01-02 NOTE — Patient Instructions (Signed)
Preventive Care 43-43 Years Old, Female Preventive care refers to visits with your health care provider and lifestyle choices that can promote health and wellness. This includes:  A yearly physical exam. This may also be called an annual well check.  Regular dental visits and eye exams.  Immunizations.  Screening for certain conditions.  Healthy lifestyle choices, such as eating a healthy diet, getting regular exercise, not using drugs or products that contain nicotine and tobacco, and limiting alcohol use. What can I expect for my preventive care visit? Physical exam Your health care provider will check your:  Height and weight. This may be used to calculate body mass index (BMI), which tells if you are at a healthy weight.  Heart rate and blood pressure.  Skin for abnormal spots. Counseling Your health care provider may ask you questions about your:  Alcohol, tobacco, and drug use.  Emotional well-being.  Home and relationship well-being.  Sexual activity.  Eating habits.  Work and work environment.  Method of birth control.  Menstrual cycle.  Pregnancy history. What immunizations do I need?  Influenza (flu) vaccine  This is recommended every year. Tetanus, diphtheria, and pertussis (Tdap) vaccine  You may need a Td booster every 10 years. Varicella (chickenpox) vaccine  You may need this if you have not been vaccinated. Zoster (shingles) vaccine  You may need this after age 60. Measles, mumps, and rubella (MMR) vaccine  You may need at least one dose of MMR if you were born in 1957 or later. You may also need a second dose. Pneumococcal conjugate (PCV13) vaccine  You may need this if you have certain conditions and were not previously vaccinated. Pneumococcal polysaccharide (PPSV23) vaccine  You may need one or two doses if you smoke cigarettes or if you have certain conditions. Meningococcal conjugate (MenACWY) vaccine  You may need this if you  have certain conditions. Hepatitis A vaccine  You may need this if you have certain conditions or if you travel or work in places where you may be exposed to hepatitis A. Hepatitis B vaccine  You may need this if you have certain conditions or if you travel or work in places where you may be exposed to hepatitis B. Haemophilus influenzae type b (Hib) vaccine  You may need this if you have certain conditions. Human papillomavirus (HPV) vaccine  If recommended by your health care provider, you may need three doses over 6 months. You may receive vaccines as individual doses or as more than one vaccine together in one shot (combination vaccines). Talk with your health care provider about the risks and benefits of combination vaccines. What tests do I need? Blood tests  Lipid and cholesterol levels. These may be checked every 5 years, or more frequently if you are over 43 years old.  Hepatitis C test.  Hepatitis B test. Screening  Lung cancer screening. You may have this screening every year starting at age 43 if you have a 30-pack-year history of smoking and currently smoke or have quit within the past 15 years.  Colorectal cancer screening. All adults should have this screening starting at age 43 and continuing until age 43. Your health care provider may recommend screening at age 43 if you are at increased risk. You will have tests every 1-10 years, depending on your results and the type of screening test.  Diabetes screening. This is done by checking your blood sugar (glucose) after you have not eaten for a while (fasting). You may have this   done every 1-3 years.  Mammogram. This may be done every 1-2 years. Talk with your health care provider about when you should start having regular mammograms. This may depend on whether you have a family history of breast cancer.  BRCA-related cancer screening. This may be done if you have a family history of breast, ovarian, tubal, or peritoneal  cancers.  Pelvic exam and Pap test. This may be done every 3 years starting at age 43. Starting at age 43, this may be done every 5 years if you have a Pap test in combination with an HPV test. Other tests  Sexually transmitted disease (STD) testing.  Bone density scan. This is done to screen for osteoporosis. You may have this scan if you are at high risk for osteoporosis. Follow these instructions at home: Eating and drinking  Eat a diet that includes fresh fruits and vegetables, whole grains, lean protein, and low-fat dairy.  Take vitamin and mineral supplements as recommended by your health care provider.  Do not drink alcohol if: ? Your health care provider tells you not to drink. ? You are pregnant, may be pregnant, or are planning to become pregnant.  If you drink alcohol: ? Limit how much you have to 0-1 drink a day. ? Be aware of how much alcohol is in your drink. In the [place] one drink equals one 12 oz bottle of beer (355 mL), one 5 oz glass of wine (148 mL), or one 1 oz glass of hard liquor (44 mL). Lifestyle  Take daily care of your teeth and gums.  Stay active. Exercise for at least 30 minutes on 5 or more days each week.  Do not use any products that contain nicotine or tobacco, such as cigarettes, e-cigarettes, and chewing tobacco. If you need help quitting, ask your health care provider.  If you are sexually active, practice safe sex. Use a condom or other form of birth control (contraception) in order to prevent pregnancy and STIs (sexually transmitted infections).  If told by your health care provider, take low-dose aspirin daily starting at age 43. What's next?  Visit your health care provider once a year for a well check visit.  Ask your health care provider how often you should have your eyes and teeth checked.  Stay up to date on all vaccines. This information is not intended to replace advice given to you by your health care provider. Make sure you  discuss any questions you have with your health care provider. Document Revised: 09/16/2017 Document Reviewed: 09/16/2017 Elsevier Patient Education  2020 [employer].

## 2020-01-02 NOTE — Progress Notes (Signed)
Subjective:  Patient ID: Ashley Porter, female    DOB: 1975/10/06  Age: 44 y.o. MRN: [id number]  No chief complaint on file.   HPI Well Adult Physical: Patient here for a comprehensive physical exam.The patient reports is having some perimenopausal symptoms including night sweats, hot flashes and only 2 menstrual cycles per year - would like hormone labwork done to monitor - has begun taking supplements from [place] and [place]  Do you take any herbs or supplements that were not prescribed by a doctor? yes  Encounter for general adult medical examination without abnormal findings   Patient's last physical exam was 1 year ago .  Smoking: Life-long non-smoker ;  Physical Activity: Exercises at least 3 times per week ;  Alcohol/Drug Use: drinks wine/social ; No illicit drug use ;  Safety: reviewed. Patient wears a seat belt, has smoke detectors, has carbon monoxide detectors, practices appropriate gun safety, and wears sunscreen with extended sun exposure. Dental Care: biannual cleanings, brushes and flosses daily. Ophthalmology/Optometry: Annual visit.  Hearing loss: none Vision impairments: none Last pap was 11/11/2018 and was normal Last mammogram 12/29/19 - results pending            Social Hx   Social History   Socioeconomic History  . Marital status: Married    Spouse name: Not on file  . Number of children: 2  . Years of education: 77  . Highest education level: Not on file  Occupational History  . Occupation: RN-NICU at [employer]  Tobacco Use  . Smoking status: Never Smoker  . Smokeless tobacco: Never Used  Vaping Use  . Vaping Use: Never used  Substance and Sexual Activity  . Alcohol use: Yes    Comment: occasional  . Drug use: No  . Sexual activity: Yes    Partners: Male    Birth control/protection: Other-see comments    Comment: vasectomy  Other Topics Concern  . Not on file  Social History Narrative  . Not on file   Social Determinants of Health    Financial Resource Strain: Not on file  Food Insecurity: Not on file  Transportation Needs: Not on file  Physical Activity: Not on file  Stress: Not on file  Social Connections: Not on file   Past Medical History:  Diagnosis Date  . BRCA negative   . Depression   . Family history of breast cancer    mom is BRCA positive and pt is BRCA negative  . Family history of ovarian cancer   . GERD (gastroesophageal reflux disease)   . History of mammogram 10/22/2015   Birads 1D had 3D mammo  . Melasma   . Urge incontinence    Past Surgical History:  Procedure Laterality Date  . BREAST BIOPSY Right 2005   neg  . BREAST BIOPSY Right 06/2017   Benign fibrocystic changes- PASH  . CHOLECYSTECTOMY      Family History  Problem Relation Age of Onset  . Breast cancer Mother 53       has BRCA1 mutation  . Basal cell carcinoma Mother   . Hypertension Father   . Ovarian cancer Maternal Grandmother 56  . Alzheimer's disease Paternal Grandmother   . Ovarian cancer Cousin 60       maternal second cousin. Maternal second cousin's daughter with ovarian cancer- has BRCA1 mutation    Review of Systems CONSTITUTIONAL: Negative for chills, fatigue, fever, unintentional weight gain and unintentional weight loss.  E/N/T: Negative for ear pain, nasal congestion and  sore throat.  CARDIOVASCULAR: Negative for chest pain, dizziness, palpitations and pedal edema.  RESPIRATORY: Negative for recent cough and dyspnea.  GASTROINTESTINAL: Negative for abdominal pain, acid reflux symptoms, constipation, diarrhea, nausea and vomiting.  MSK: Negative for arthralgias and myalgias.  INTEGUMENTARY: Negative for rash.  NEUROLOGICAL: Negative for dizziness and headaches.  PSYCHIATRIC: has had mild sleeping issues       Objective:  BP 118/76 (BP Location: Left Arm, Patient Position: Sitting, Cuff Size: Normal)   Pulse 64   Temp 97.7 F (36.5 C) (Temporal)   Ht '5\' 4"'  (1.626 m)   Wt 146 lb 9.6 oz (66.5 kg)    SpO2 99%   BMI 25.16 kg/m   BP/Weight 01/02/2020 11/11/2018 19/62/2297  Systolic BP 989 211 90  Diastolic BP 76 70 70  Wt. (Lbs) 146.6 141 138  BMI 25.16 24.2 23.69    Physical Exam PHYSICAL EXAM:   VS: BP 118/76 (BP Location: Left Arm, Patient Position: Sitting, Cuff Size: Normal)   Pulse 64   Temp 97.7 F (36.5 C) (Temporal)   Ht '5\' 4"'  (1.626 m)   Wt 146 lb 9.6 oz (66.5 kg)   SpO2 99%   BMI 25.16 kg/m   GEN: Well nourished, well developed, in no acute distress  HEENT: normal external ears and nose - normal external auditory canals and TMS - hearing grossly normal - normal nasal mucosa and septum - Lips, Teeth and Gums - normal  Oropharynx - normal mucosa, palate, and posterior pharynx Cardiac: RRR; no murmurs, rubs, or gallops,no edema -  Respiratory:  normal respiratory rate and pattern with no distress - normal breath sounds with no rales, rhonchi, wheezes or rubs GI: normal bowel sounds, no masses or tenderness MS: no deformity or atrophy  Skin: warm and dry, no rash  Neuro:  Alert and Oriented x 3, Strength and sensation are intact - CN II-Xii grossly intact Psych: euthymic mood, appropriate affect and demeanor  Lab Results  Component Value Date   WBC 7.2 11/08/2018   HGB 14.4 11/08/2018   HCT 41.8 11/08/2018   PLT 227 11/08/2018   GLUCOSE 86 11/08/2018   CHOL 134 11/08/2018   TRIG 76 11/08/2018   HDL 79 11/08/2018   LDLCALC 40 11/08/2018   ALT 12 11/08/2018   AST 26 11/08/2018   NA 141 11/08/2018   K 4.3 11/08/2018   CL 104 11/08/2018   CREATININE 1.00 11/08/2018   BUN 14 11/08/2018   CO2 24 11/08/2018   TSH 1.800 11/08/2018      Assessment & Plan:  1. Routine general medical examination at a health care facility - CBC With Diff/Platelet - Comprehensive metabolic panel - TSH - Lipid panel - VITAMIN D 25 Hydroxy (Vit-D Deficiency, Fractures)  2. Menopausal and perimenopausal disorder - FSH/LH - Prolactin - Estradiol    Body mass index  is 25.16 kg/m.   These are the goals we discussed: Goals   None      This is a list of the screening recommended for you and due dates:  Health Maintenance  Topic Date Due  . COVID-19 Vaccine (2 - Booster for [employer] series) 06/21/2019  . Tetanus Vaccine  01/01/2021*  . Pap Smear  11/10/2021  . Flu Shot  Completed  .  Hepatitis C: One time screening is recommended by Center for Disease Control  (CDC) for  adults born from [birth year] through 1965.   Discontinued  . HIV Screening  Discontinued  *Topic was postponed. The date shown  is not the original due date.     AN INDIVIDUALIZED CARE PLAN: was established or reinforced today.   SELF MANAGEMENT: The patient and I together assessed ways to personally work towards obtaining the recommended goals  Support needs The patient and/or family needs were assessed and services were offered if appropriate.  No orders of the defined types were placed in this encounter.   Follow-up: Return in about 1 year (around 01/01/2021) for fasting physical.  An After Visit Summary was printed and given to the patient.  Yetta Flock [facility] [phone]

## 2020-01-03 LAB — CBC WITH DIFF/PLATELET
Basophils Absolute: 0 10*3/uL (ref 0.0–0.2)
Basos: 1 %
EOS (ABSOLUTE): 0 10*3/uL (ref 0.0–0.4)
Eos: 1 %
Hematocrit: 42.8 % (ref 34.0–46.6)
Hemoglobin: 14.6 g/dL (ref 11.1–15.9)
Immature Grans (Abs): 0 10*3/uL (ref 0.0–0.1)
Immature Granulocytes: 0 %
Lymphocytes Absolute: 1.4 10*3/uL (ref 0.7–3.1)
Lymphs: 31 %
MCH: 30.6 pg (ref 26.6–33.0)
MCHC: 34.1 g/dL (ref 31.5–35.7)
MCV: 90 fL (ref 79–97)
Monocytes Absolute: 0.4 10*3/uL (ref 0.1–0.9)
Monocytes: 9 %
Neutrophils Absolute: 2.6 10*3/uL (ref 1.4–7.0)
Neutrophils: 58 %
Platelets: 227 10*3/uL (ref 150–450)
RBC: 4.77 x10E6/uL (ref 3.77–5.28)
RDW: 11.9 % (ref 11.7–15.4)
WBC: 4.4 10*3/uL (ref 3.4–10.8)

## 2020-01-03 LAB — COMPREHENSIVE METABOLIC PANEL
ALT: 29 IU/L (ref 0–32)
AST: 28 IU/L (ref 0–40)
Albumin/Globulin Ratio: 2.1 (ref 1.2–2.2)
Albumin: 4.6 g/dL (ref 3.8–4.8)
Alkaline Phosphatase: 63 IU/L (ref 44–121)
BUN/Creatinine Ratio: 13 (ref 9–23)
BUN: 11 mg/dL (ref 6–24)
Bilirubin Total: 0.6 mg/dL (ref 0.0–1.2)
CO2: 25 mmol/L (ref 20–29)
Calcium: 9.9 mg/dL (ref 8.7–10.2)
Chloride: 100 mmol/L (ref 96–106)
Creatinine, Ser: 0.87 mg/dL (ref 0.57–1.00)
GFR calc Af Amer: 94 mL/min/{1.73_m2} (ref >59)
GFR calc non Af Amer: 82 mL/min/{1.73_m2} (ref >59)
Globulin, Total: 2.2 g/dL (ref 1.5–4.5)
Glucose: 82 mg/dL (ref 65–99)
Potassium: 4.5 mmol/L (ref 3.5–5.2)
Sodium: 138 mmol/L (ref 134–144)
Total Protein: 6.8 g/dL (ref 6.0–8.5)

## 2020-01-03 LAB — LIPID PANEL
Chol/HDL Ratio: 1.7 ratio (ref 0.0–4.4)
Cholesterol, Total: 159 mg/dL (ref 100–199)
HDL: 93 mg/dL (ref >39)
LDL Chol Calc (NIH): 52 mg/dL (ref 0–99)
Triglycerides: 73 mg/dL (ref 0–149)
VLDL Cholesterol Cal: 14 mg/dL (ref 5–40)

## 2020-01-03 LAB — FSH/LH
FSH: 47.3 m[IU]/mL
LH: 89.1 m[IU]/mL

## 2020-01-03 LAB — CARDIOVASCULAR RISK ASSESSMENT

## 2020-01-03 LAB — TSH: TSH: 1.72 u[IU]/mL (ref 0.450–4.500)

## 2020-01-03 LAB — ESTRADIOL: Estradiol: 209 pg/mL

## 2020-01-03 LAB — PROLACTIN: Prolactin: 20.1 ng/mL (ref 4.8–23.3)

## 2020-01-03 LAB — VITAMIN D 25 HYDROXY (VIT D DEFICIENCY, FRACTURES): Vit D, 25-Hydroxy: 66.7 ng/mL (ref 30.0–100.0)

## 2020-01-11 DIAGNOSIS — H5213 Myopia, bilateral: Secondary | ICD-10-CM | POA: Diagnosis not present

## 2020-01-17 ENCOUNTER — Other Ambulatory Visit: Payer: Self-pay | Admitting: Physician Assistant

## 2020-01-17 ENCOUNTER — Encounter: Payer: Self-pay | Admitting: Physician Assistant

## 2020-01-17 MED ORDER — CITALOPRAM HYDROBROMIDE 10 MG PO TABS: 10.0000 mg | ORAL_TABLET | Freq: Every day | ORAL | 1 refills | Status: DC

## 2020-01-17 NOTE — Telephone Encounter (Signed)
Will send in to try  Make follow up in one month

## 2020-01-18 ENCOUNTER — Encounter: Payer: Self-pay | Admitting: Physician Assistant

## 2020-01-20 ENCOUNTER — Other Ambulatory Visit: Payer: Self-pay | Admitting: Physician Assistant

## 2020-01-20 MED ORDER — CITALOPRAM HYDROBROMIDE 10 MG PO TABS: 10.0000 mg | ORAL_TABLET | Freq: Every day | ORAL | 1 refills | Status: DC

## 2020-01-20 NOTE — Telephone Encounter (Signed)
The med was already sent in to [facility] but will send to [facility]

## 2020-01-23 ENCOUNTER — Other Ambulatory Visit: Payer: Self-pay | Admitting: Physician Assistant

## 2020-01-23 MED ORDER — ESCITALOPRAM OXALATE 10 MG PO TABS: 10.0000 mg | ORAL_TABLET | Freq: Every day | ORAL | 2 refills | Status: DC

## 2020-01-23 NOTE — Telephone Encounter (Signed)
Lexapro was the one that was supposed to be called in  Will [facility] to [facility]

## 2020-05-19 ENCOUNTER — Encounter: Payer: Self-pay | Admitting: Physician Assistant

## 2020-05-19 DIAGNOSIS — N3091 Cystitis, unspecified with hematuria: Secondary | ICD-10-CM | POA: Diagnosis not present

## 2020-05-19 DIAGNOSIS — N3001 Acute cystitis with hematuria: Secondary | ICD-10-CM | POA: Diagnosis not present

## 2020-07-30 DIAGNOSIS — M79672 Pain in left foot: Secondary | ICD-10-CM | POA: Diagnosis not present

## 2020-07-30 DIAGNOSIS — M2012 Hallux valgus (acquired), left foot: Secondary | ICD-10-CM | POA: Diagnosis not present

## 2020-07-30 DIAGNOSIS — M65872 Other synovitis and tenosynovitis, left ankle and foot: Secondary | ICD-10-CM | POA: Diagnosis not present

## 2020-07-30 DIAGNOSIS — M2011 Hallux valgus (acquired), right foot: Secondary | ICD-10-CM | POA: Diagnosis not present

## 2020-08-26 ENCOUNTER — Other Ambulatory Visit: Payer: Self-pay

## 2020-08-26 MED FILL — Etodolac Tab 400 MG: ORAL | 30 days supply | Qty: 60 | Fill #0 | Status: AC

## 2020-10-28 ENCOUNTER — Other Ambulatory Visit: Payer: Self-pay

## 2020-10-28 MED FILL — Etodolac Tab 400 MG: ORAL | 30 days supply | Qty: 60 | Fill #1 | Status: AC

## 2020-11-11 ENCOUNTER — Other Ambulatory Visit: Payer: Self-pay | Admitting: Physician Assistant

## 2020-11-11 DIAGNOSIS — Z1231 Encounter for screening mammogram for malignant neoplasm of breast: Secondary | ICD-10-CM

## 2020-12-17 DIAGNOSIS — M79672 Pain in left foot: Secondary | ICD-10-CM | POA: Diagnosis not present

## 2020-12-17 DIAGNOSIS — M65872 Other synovitis and tenosynovitis, left ankle and foot: Secondary | ICD-10-CM | POA: Diagnosis not present

## 2020-12-17 DIAGNOSIS — M2012 Hallux valgus (acquired), left foot: Secondary | ICD-10-CM | POA: Diagnosis not present

## 2020-12-18 ENCOUNTER — Other Ambulatory Visit: Payer: Self-pay

## 2020-12-18 ENCOUNTER — Ambulatory Visit (INDEPENDENT_AMBULATORY_CARE_PROVIDER_SITE_OTHER): Payer: 59 | Admitting: Physician Assistant

## 2020-12-18 ENCOUNTER — Encounter: Payer: Self-pay | Admitting: Physician Assistant

## 2020-12-18 VITALS — BP 110/74 | HR 63 | Temp 97.3°F | Ht 64.0 in | Wt 145.6 lb

## 2020-12-18 DIAGNOSIS — N3001 Acute cystitis with hematuria: Secondary | ICD-10-CM

## 2020-12-18 LAB — POCT URINALYSIS DIP (CLINITEK)
Glucose, UA: NEGATIVE mg/dL
Ketones, POC UA: NEGATIVE mg/dL
Nitrite, UA: POSITIVE — AB
POC PROTEIN,UA: >=300 — AB
Spec Grav, UA: >=1.03 — AB (ref 1.010–1.025)
Urobilinogen, UA: 1 E.U./dL
pH, UA: 5.5 (ref 5.0–8.0)

## 2020-12-18 MED ORDER — NITROFURANTOIN MONOHYD MACRO 100 MG PO CAPS: 100.0000 mg | ORAL_CAPSULE | Freq: Two times a day (BID) | ORAL | 0 refills | Status: DC

## 2020-12-18 MED ORDER — CEFTRIAXONE SODIUM 1 G IJ SOLR
1.0000 g | Freq: Once | INTRAMUSCULAR | Status: AC
  Administered 2020-12-18: 1 g via INTRAMUSCULAR

## 2020-12-18 MED ORDER — PHENAZOPYRIDINE HCL 200 MG PO TABS: 200.0000 mg | ORAL_TABLET | Freq: Three times a day (TID) | ORAL | 1 refills | Status: DC | PRN

## 2020-12-18 NOTE — Progress Notes (Signed)
Acute Office Visit  Subjective:    Patient ID: Ashley Porter, female    DOB: 07/12/1975, 45 y.o.   MRN: [id number]  Chief Complaint  Patient presents with   Urinary Tract Infection    HPI: Patient is in today for complaints of uti - states that symptoms initially started last week - had some 'twinge' type sensations with urination - since that time she has had dysuria, lower back pain and urgency - this morning noted hematuria She denies nausea, vomiting or fever - has had some malaise This is her 3rd uti in the past 2 years  Past Medical History:  Diagnosis Date   BRCA negative    Depression    Family history of breast cancer    mom is BRCA positive and pt is BRCA negative   Family history of ovarian cancer    GERD (gastroesophageal reflux disease)    History of mammogram 10/22/2015   Birads 1D had 3D mammo   Melasma    Urge incontinence     Past Surgical History:  Procedure Laterality Date   BREAST BIOPSY Right 2005   neg   BREAST BIOPSY Right 06/2017   Benign fibrocystic changes- PASH   CHOLECYSTECTOMY      Family History  Problem Relation Age of Onset   Breast cancer Mother 73       has BRCA1 mutation   Basal cell carcinoma Mother    Hypertension Father    Ovarian cancer Maternal Grandmother 72   Alzheimer's disease Paternal Grandmother    Ovarian cancer Cousin 53       maternal second cousin. Maternal second cousin's daughter with ovarian cancer- has BRCA1 mutation    Social History   Socioeconomic History   Marital status: Married    Spouse name: Not on file   Number of children: 2   Years of education: 16   Highest education level: Not on file  Occupational History   Occupation: RN-NICU at [employer]  Tobacco Use   Smoking status: Never   Smokeless tobacco: Never  Vaping Use   Vaping Use: Never used  Substance and Sexual Activity   Alcohol use: Yes    Comment: occasional   Drug use: No   Sexual activity: Yes    Partners: Male    Birth  control/protection: Other-see comments    Comment: vasectomy  Other Topics Concern   Not on file  Social History Narrative   Not on file   Social Determinants of Health   Financial Resource Strain: Not on file  Food Insecurity: Not on file  Transportation Needs: Not on file  Physical Activity: Not on file  Stress: Not on file  Social Connections: Not on file  Intimate Partner Violence: Not on file    Outpatient Medications Prior to Visit  Medication Sig Dispense Refill   Cholecalciferol (VITAMIN D3) 5000 units TBDP Take by mouth.     Melatonin 10 MG TABS Take by mouth.     Multiple Vitamin (MULTIVITAMIN) tablet Take 1 tablet by mouth daily.     Probiotic Product (PROBIOTIC-10 PO) Take by mouth.     valACYclovir (VALTREX) 1000 MG tablet Take 2000 mgm every 12 hours x 2 doses prn fever blisters 20 tablet 1   escitalopram (LEXAPRO) 10 MG tablet Take 1 tablet (10 mg total) by mouth daily. 30 tablet 2   No facility-administered medications prior to visit.    No Known Allergies  Review of Systems CONSTITUTIONAL: see HPI E/N/T: Negative  for ear pain, nasal congestion and sore throat.  CARDIOVASCULAR: Negative for chest pain, dizziness, palpitations and pedal edema.  RESPIRATORY: Negative for recent cough and dyspnea.  GASTROINTESTINAL: Negative for abdominal pain, acid reflux symptoms, constipation, diarrhea, nausea and vomiting.  GU - see HPI        Objective:    Physical Exam PHYSICAL EXAM:   VS: BP 110/74 (BP Location: Left Arm, Patient Position: Sitting, Cuff Size: Normal)   Pulse 63   Temp (!) 97.3 F (36.3 C) (Temporal)   Ht '5\' 4"'  (1.626 m)   Wt 145 lb 9.6 oz (66 kg)   SpO2 98%   BMI 24.99 kg/m   GEN: Well nourished, well developed, in no acute distress  Cardiac: RRR; no murmurs, rubs, or gallops,no edema -  Respiratory:  normal respiratory rate and pattern with no distress - normal breath sounds with no rales, rhonchi, wheezes or rubs GI: normal bowel  sounds, no masses or tenderness  Office Visit on 12/18/2020  Component Date Value Ref Range Status   Color, UA 12/18/2020 brown (A)  yellow Final   Clarity, UA 12/18/2020 clear  clear Final   Glucose, UA 12/18/2020 negative  negative mg/dL Final   Bilirubin, UA 12/18/2020 small (A)  negative Final   Ketones, POC UA 12/18/2020 negative  negative mg/dL Final   Spec Grav, UA 12/18/2020 >=1.030 (A)  1.010 - 1.025 Final   Blood, UA 12/18/2020 large (A)  negative Final   pH, UA 12/18/2020 5.5  5.0 - 8.0 Final   POC PROTEIN,UA 12/18/2020 >=300 (A)  negative, trace Final   Urobilinogen, UA 12/18/2020 1.0  0.2 or 1.0 E.U./dL Final   Nitrite, UA 12/18/2020 Positive (A)  Negative Final   Leukocytes, UA 12/18/2020 Large (3+) (A)  Negative Final     BP 110/74 (BP Location: Left Arm, Patient Position: Sitting, Cuff Size: Normal)   Pulse 63   Temp (!) 97.3 F (36.3 C) (Temporal)   Ht '5\' 4"'  (1.626 m)   Wt 145 lb 9.6 oz (66 kg)   SpO2 98%   BMI 24.99 kg/m  Wt Readings from Last 3 Encounters:  12/18/20 145 lb 9.6 oz (66 kg)  01/02/20 146 lb 9.6 oz (66.5 kg)  11/11/18 141 lb (64 kg)    Health Maintenance Due  Topic Date Due   COVID-19 Vaccine (2 - Booster for Janssen series) 06/21/2019   INFLUENZA VACCINE  08/19/2020    There are no preventive care reminders to display for this patient.   Lab Results  Component Value Date   TSH 1.720 01/02/2020   Lab Results  Component Value Date   WBC 4.4 01/02/2020   HGB 14.6 01/02/2020   HCT 42.8 01/02/2020   MCV 90 01/02/2020   PLT 227 01/02/2020   Lab Results  Component Value Date   NA 138 01/02/2020   K 4.5 01/02/2020   CO2 25 01/02/2020   GLUCOSE 82 01/02/2020   BUN 11 01/02/2020   CREATININE 0.87 01/02/2020   BILITOT 0.6 01/02/2020   ALKPHOS 63 01/02/2020   AST 28 01/02/2020   ALT 29 01/02/2020   PROT 6.8 01/02/2020   ALBUMIN 4.6 01/02/2020   CALCIUM 9.9 01/02/2020   ANIONGAP 7 05/12/2011   Lab Results  Component Value  Date   CHOL 159 01/02/2020   Lab Results  Component Value Date   HDL 93 01/02/2020   Lab Results  Component Value Date   LDLCALC 52 01/02/2020   Lab Results  Component  Value Date   TRIG 73 01/02/2020   Lab Results  Component Value Date   CHOLHDL 1.7 01/02/2020   No results found for: HGBA1C     Assessment & Plan:   Problem List Items Addressed This Visit   None Visit Diagnoses     Acute cystitis with hematuria    -  Primary --- early Pyelonephritis   Relevant Medications   cefTRIAXone (ROCEPHIN) injection 1 g (Start on 12/18/2020 10:45 AM)   nitrofurantoin, macrocrystal-monohydrate, (MACROBID) 100 MG capsule   phenazopyridine (PYRIDIUM) 200 MG tablet   Other Relevant Orders   POCT URINALYSIS DIP (CLINITEK) (Completed)   Urine Culture   CBC with Differential/Platelet   Comprehensive metabolic panel      Meds ordered this encounter  Medications   cefTRIAXone (ROCEPHIN) injection 1 g   nitrofurantoin, macrocrystal-monohydrate, (MACROBID) 100 MG capsule    Sig: Take 1 capsule (100 mg total) by mouth 2 (two) times daily.    Dispense:  20 capsule    Refill:  0    Order Specific Question:   Supervising Provider    Answer:   Shelton Silvas   phenazopyridine (PYRIDIUM) 200 MG tablet    Sig: Take 1 tablet (200 mg total) by mouth 3 (three) times daily as needed for pain.    Dispense:  10 tablet    Refill:  1    Order Specific Question:   Supervising Provider    AnswerShelton Silvas    Orders Placed This Encounter  Procedures   Urine Culture   CBC with Differential/Platelet   Comprehensive metabolic panel   POCT URINALYSIS DIP (CLINITEK)     Follow-up: Return if symptoms worsen or fail to improve.  An After Visit Summary was printed and given to the patient.  Yetta Flock [facility] [phone]

## 2020-12-19 LAB — COMPREHENSIVE METABOLIC PANEL
ALT: 13 IU/L (ref 0–32)
AST: 20 IU/L (ref 0–40)
Albumin/Globulin Ratio: 1.8 (ref 1.2–2.2)
Albumin: 4.5 g/dL (ref 3.8–4.8)
Alkaline Phosphatase: 91 IU/L (ref 44–121)
BUN/Creatinine Ratio: 18 (ref 9–23)
BUN: 16 mg/dL (ref 6–24)
Bilirubin Total: 0.5 mg/dL (ref 0.0–1.2)
CO2: 24 mmol/L (ref 20–29)
Calcium: 9.7 mg/dL (ref 8.7–10.2)
Chloride: 100 mmol/L (ref 96–106)
Creatinine, Ser: 0.87 mg/dL (ref 0.57–1.00)
Globulin, Total: 2.5 g/dL (ref 1.5–4.5)
Glucose: 86 mg/dL (ref 70–99)
Potassium: 3.8 mmol/L (ref 3.5–5.2)
Sodium: 138 mmol/L (ref 134–144)
Total Protein: 7 g/dL (ref 6.0–8.5)
eGFR: 84 mL/min/{1.73_m2} (ref >59)

## 2020-12-19 LAB — CBC WITH DIFFERENTIAL/PLATELET
Basophils Absolute: 0 10*3/uL (ref 0.0–0.2)
Basos: 0 %
EOS (ABSOLUTE): 0 10*3/uL (ref 0.0–0.4)
Eos: 0 %
Hematocrit: 44.7 % (ref 34.0–46.6)
Hemoglobin: 14.8 g/dL (ref 11.1–15.9)
Immature Grans (Abs): 0 10*3/uL (ref 0.0–0.1)
Immature Granulocytes: 0 %
Lymphocytes Absolute: 1.6 10*3/uL (ref 0.7–3.1)
Lymphs: 13 %
MCH: 30.3 pg (ref 26.6–33.0)
MCHC: 33.1 g/dL (ref 31.5–35.7)
MCV: 91 fL (ref 79–97)
Monocytes Absolute: 0.8 10*3/uL (ref 0.1–0.9)
Monocytes: 6 %
Neutrophils Absolute: 10.3 10*3/uL — ABNORMAL HIGH (ref 1.4–7.0)
Neutrophils: 81 %
Platelets: 240 10*3/uL (ref 150–450)
RBC: 4.89 x10E6/uL (ref 3.77–5.28)
RDW: 12.9 % (ref 11.7–15.4)
WBC: 12.8 10*3/uL — ABNORMAL HIGH (ref 3.4–10.8)

## 2020-12-21 LAB — URINE CULTURE

## 2020-12-30 ENCOUNTER — Ambulatory Visit
Admission: RE | Admit: 2020-12-30 | Discharge: 2020-12-30 | Disposition: A | Discharge status: Home / Self Care | Payer: 59 | Source: Ambulatory Visit | Attending: Physician Assistant | Admitting: Physician Assistant

## 2020-12-30 DIAGNOSIS — Z1231 Encounter for screening mammogram for malignant neoplasm of breast: Secondary | ICD-10-CM

## 2021-01-02 ENCOUNTER — Other Ambulatory Visit: Payer: Self-pay

## 2021-01-02 ENCOUNTER — Ambulatory Visit (INDEPENDENT_AMBULATORY_CARE_PROVIDER_SITE_OTHER): Payer: 59 | Admitting: Physician Assistant

## 2021-01-02 ENCOUNTER — Encounter: Payer: Self-pay | Admitting: Physician Assistant

## 2021-01-02 VITALS — BP 112/70 | HR 55 | Temp 97.0°F | Ht 64.0 in | Wt 146.8 lb

## 2021-01-02 DIAGNOSIS — Z0000 Encounter for general adult medical examination without abnormal findings: Secondary | ICD-10-CM

## 2021-01-02 LAB — POCT URINALYSIS DIP (CLINITEK)
Bilirubin, UA: NEGATIVE
Blood, UA: NEGATIVE
Glucose, UA: NEGATIVE mg/dL
Ketones, POC UA: NEGATIVE mg/dL
Leukocytes, UA: NEGATIVE
Nitrite, UA: NEGATIVE
Spec Grav, UA: 1.015 (ref 1.010–1.025)
Urobilinogen, UA: 0.2 E.U./dL
pH, UA: 6 (ref 5.0–8.0)

## 2021-01-02 NOTE — Progress Notes (Signed)
Subjective:  Patient ID: Ashley Porter, female    DOB: July 20, 1975  Age: 45 y.o. MRN: [id number]  Chief Complaint  Patient presents with   Annual Exam    HPI Well Adult Physical: Patient here for a comprehensive physical exam.The patient reports no problems Do you take any herbs or supplements that were not prescribed by a doctor? no Are you taking calcium supplements? no Are you taking aspirin daily? no  Encounter for general adult medical examination without abnormal findings  Physical ("At Risk" items are starred): Patient's last physical exam was 1 year ago .  Patient is not afflicted from Stress Incontinence and Urge Incontinence  Patient wears a seat belt, has smoke detectors, has carbon monoxide detectors, practices appropriate gun safety, and wears sunscreen with extended sun exposure. Dental Care: biannual cleanings, brushes and flosses daily. Ophthalmology/Optometry: Annual visit.  Hearing loss: none Vision impairments: none   LMP: spotting recently - abnormal menses Mammogram 12/30/21  Flowsheet Row Office Visit from 01/02/2021 in [facility]  PHQ-2 Total Score 0               Social Hx   Social History   Socioeconomic History   Marital status: Married    Spouse name: Not on file   Number of children: 2   Years of education: 16   Highest education level: Not on file  Occupational History   Occupation: RN-NICU at [employer]  Tobacco Use   Smoking status: Never   Smokeless tobacco: Never  Vaping Use   Vaping Use: Never used  Substance and Sexual Activity   Alcohol use: Yes    Comment: occasional   Drug use: No   Sexual activity: Yes    Partners: Male    Birth control/protection: Other-see comments    Comment: vasectomy  Other Topics Concern   Not on file  Social History Narrative   Not on file   Social Determinants of Health   Financial Resource Strain: Not on file  Food Insecurity: Not on file  Transportation Needs: Not on file   Physical Activity: Not on file  Stress: Not on file  Social Connections: Not on file   Past Medical History:  Diagnosis Date   BRCA negative    Depression    Family history of breast cancer    mom is BRCA positive and pt is BRCA negative   Family history of ovarian cancer    GERD (gastroesophageal reflux disease)    History of mammogram 10/22/2015   Birads 1D had 3D mammo   Melasma    Urge incontinence    Past Surgical History:  Procedure Laterality Date   BREAST BIOPSY Right 2005   neg   BREAST BIOPSY Right 06/2017   Benign fibrocystic changes- PASH   CHOLECYSTECTOMY      Family History  Problem Relation Age of Onset   Breast cancer Mother 78       has BRCA1 mutation   Basal cell carcinoma Mother    Hypertension Father    Ovarian cancer Maternal Grandmother 76   Alzheimer's disease Paternal Grandmother    Ovarian cancer Cousin 73       maternal second cousin. Maternal second cousin's daughter with ovarian cancer- has BRCA1 mutation    Review of Systems CONSTITUTIONAL: Negative for chills, fatigue, fever, unintentional weight gain and unintentional weight loss.  E/N/T: Negative for ear pain, nasal congestion and sore throat.  CARDIOVASCULAR: Negative for chest pain, dizziness, palpitations and pedal edema.  RESPIRATORY: Negative for recent cough and dyspnea.  GASTROINTESTINAL: Negative for abdominal pain, acid reflux symptoms, constipation, diarrhea, nausea and vomiting.  MSK: Negative for arthralgias and myalgias.  INTEGUMENTARY: Negative for rash.  NEUROLOGICAL: Negative for dizziness and headaches.  PSYCHIATRIC: Negative for sleep disturbance and to question depression screen.  Negative for depression, negative for anhedonia.       Objective:  BP 112/70 (BP Location: Left Arm, Patient Position: Sitting, Cuff Size: Normal)    Pulse (!) 55    Temp (!) 97 F (36.1 C) (Temporal)    Ht '5\' 4"'  (1.626 m)    Wt 146 lb 12.8 oz (66.6 kg)    SpO2 99%    BMI 25.20 kg/m    BP/Weight 01/02/2021 12/18/2020 77/82/4235  Systolic BP 361 443 154  Diastolic BP 70 74 76  Wt. (Lbs) 146.8 145.6 146.6  BMI 25.2 24.99 25.16    Physical Exam PHYSICAL EXAM:   VS: BP 112/70 (BP Location: Left Arm, Patient Position: Sitting, Cuff Size: Normal)    Pulse (!) 55    Temp (!) 97 F (36.1 C) (Temporal)    Ht '5\' 4"'  (1.626 m)    Wt 146 lb 12.8 oz (66.6 kg)    SpO2 99%    BMI 25.20 kg/m   GEN: Well nourished, well developed, in no acute distress  HEENT: normal external ears and nose - normal external auditory canals and TMS - hearing grossly normal - Lips, Teeth and Gums - normal  Oropharynx - normal mucosa, palate, and posterior pharynx Cardiac: RRR; no murmurs, rubs, or gallops,no edema -  Respiratory:  normal respiratory rate and pattern with no distress - normal breath sounds with no rales, rhonchi, wheezes or rubs GI: normal bowel sounds, no masses or tenderness MS: no deformity or atrophy  Skin: warm and dry, no rash  Psych: euthymic mood, appropriate affect and demeanor  Office Visit on 01/02/2021  Component Date Value Ref Range Status   Color, UA 01/02/2021 yellow  yellow Final   Clarity, UA 01/02/2021 clear  clear Final   Glucose, UA 01/02/2021 negative  negative mg/dL Final   Bilirubin, UA 01/02/2021 negative  negative Final   Ketones, POC UA 01/02/2021 negative  negative mg/dL Final   Spec Grav, UA 01/02/2021 1.015  1.010 - 1.025 Final   Blood, UA 01/02/2021 negative  negative Final   pH, UA 01/02/2021 6.0  5.0 - 8.0 Final   POC PROTEIN,UA 01/02/2021 trace  negative, trace Final   Urobilinogen, UA 01/02/2021 0.2  0.2 or 1.0 E.U./dL Final   Nitrite, UA 01/02/2021 Negative  Negative Final   Leukocytes, UA 01/02/2021 Negative  Negative Final    Lab Results  Component Value Date   WBC 12.8 (H) 12/18/2020   HGB 14.8 12/18/2020   HCT 44.7 12/18/2020   PLT 240 12/18/2020   GLUCOSE 86 12/18/2020   CHOL 159 01/02/2020   TRIG 73 01/02/2020   HDL 93  01/02/2020   LDLCALC 52 01/02/2020   ALT 13 12/18/2020   AST 20 12/18/2020   NA 138 12/18/2020   K 3.8 12/18/2020   CL 100 12/18/2020   CREATININE 0.87 12/18/2020   BUN 16 12/18/2020   CO2 24 12/18/2020   TSH 1.720 01/02/2020      Assessment & Plan:   Problem List Items Addressed This Visit   None Visit Diagnoses     Annual physical exam    -  Primary   Relevant Orders   POCT URINALYSIS DIP (  CLINITEK) (Completed)   CBC with Differential/Platelet   Comprehensive metabolic panel   TSH   Lipid panel         Body mass index is 25.2 kg/m.   These are the goals we discussed:  Goals   None      This is a list of the screening recommended for you and due dates:  Health Maintenance  Topic Date Due   Tetanus Vaccine  Never done   COVID-19 Vaccine (2 - Booster for Janssen series) 06/21/2019   Flu Shot  08/19/2020   Pap Smear  11/10/2021   Mammogram  12/30/2021   Pneumococcal Vaccination  Aged Out   HPV Vaccine  Aged Out   Hepatitis C Screening: USPSTF Recommendation to screen - Ages 18-79 yo.  Discontinued   HIV Screening  Discontinued     No orders of the defined types were placed in this encounter.   Follow-up: Return in about 1 year (around 01/02/2022) for fasting physical.  An After Visit Summary was printed and given to the patient.  Yetta Flock [facility] [phone]

## 2021-01-03 LAB — CBC WITH DIFFERENTIAL/PLATELET
Basophils Absolute: 0.1 10*3/uL (ref 0.0–0.2)
Basos: 1 %
EOS (ABSOLUTE): 0.1 10*3/uL (ref 0.0–0.4)
Eos: 2 %
Hematocrit: 44 % (ref 34.0–46.6)
Hemoglobin: 14.4 g/dL (ref 11.1–15.9)
Immature Grans (Abs): 0 10*3/uL (ref 0.0–0.1)
Immature Granulocytes: 0 %
Lymphocytes Absolute: 1.1 10*3/uL (ref 0.7–3.1)
Lymphs: 30 %
MCH: 29.6 pg (ref 26.6–33.0)
MCHC: 32.7 g/dL (ref 31.5–35.7)
MCV: 90 fL (ref 79–97)
Monocytes Absolute: 0.4 10*3/uL (ref 0.1–0.9)
Monocytes: 11 %
Neutrophils Absolute: 2.1 10*3/uL (ref 1.4–7.0)
Neutrophils: 56 %
Platelets: 214 10*3/uL (ref 150–450)
RBC: 4.87 x10E6/uL (ref 3.77–5.28)
RDW: 12 % (ref 11.7–15.4)
WBC: 3.8 10*3/uL (ref 3.4–10.8)

## 2021-01-03 LAB — COMPREHENSIVE METABOLIC PANEL
ALT: 18 IU/L (ref 0–32)
AST: 20 IU/L (ref 0–40)
Albumin/Globulin Ratio: 2.1 (ref 1.2–2.2)
Albumin: 4.4 g/dL (ref 3.8–4.8)
Alkaline Phosphatase: 64 IU/L (ref 44–121)
BUN/Creatinine Ratio: 17 (ref 9–23)
BUN: 16 mg/dL (ref 6–24)
Bilirubin Total: 0.6 mg/dL (ref 0.0–1.2)
CO2: 25 mmol/L (ref 20–29)
Calcium: 9 mg/dL (ref 8.7–10.2)
Chloride: 100 mmol/L (ref 96–106)
Creatinine, Ser: 0.96 mg/dL (ref 0.57–1.00)
Globulin, Total: 2.1 g/dL (ref 1.5–4.5)
Glucose: 81 mg/dL (ref 70–99)
Potassium: 4.4 mmol/L (ref 3.5–5.2)
Sodium: 136 mmol/L (ref 134–144)
Total Protein: 6.5 g/dL (ref 6.0–8.5)
eGFR: 75 mL/min/{1.73_m2} (ref >59)

## 2021-01-03 LAB — LIPID PANEL
Chol/HDL Ratio: 1.8 ratio (ref 0.0–4.4)
Cholesterol, Total: 147 mg/dL (ref 100–199)
HDL: 84 mg/dL (ref >39)
LDL Chol Calc (NIH): 49 mg/dL (ref 0–99)
Triglycerides: 68 mg/dL (ref 0–149)
VLDL Cholesterol Cal: 14 mg/dL (ref 5–40)

## 2021-01-03 LAB — CARDIOVASCULAR RISK ASSESSMENT

## 2021-01-03 LAB — TSH: TSH: 3.01 u[IU]/mL (ref 0.450–4.500)

## 2021-01-17 DIAGNOSIS — H5213 Myopia, bilateral: Secondary | ICD-10-CM | POA: Diagnosis not present

## 2021-03-11 IMAGING — MG DIGITAL SCREENING BILAT W/ TOMO W/ CAD
8 series · 9 of 24 positions shown · non-contrast
Comparison: Previous exam(s).

CLINICAL DATA: Screening.

EXAM:
DIGITAL SCREENING BILATERAL MAMMOGRAM WITH TOMO AND CAD

[L MLO synth-2D]
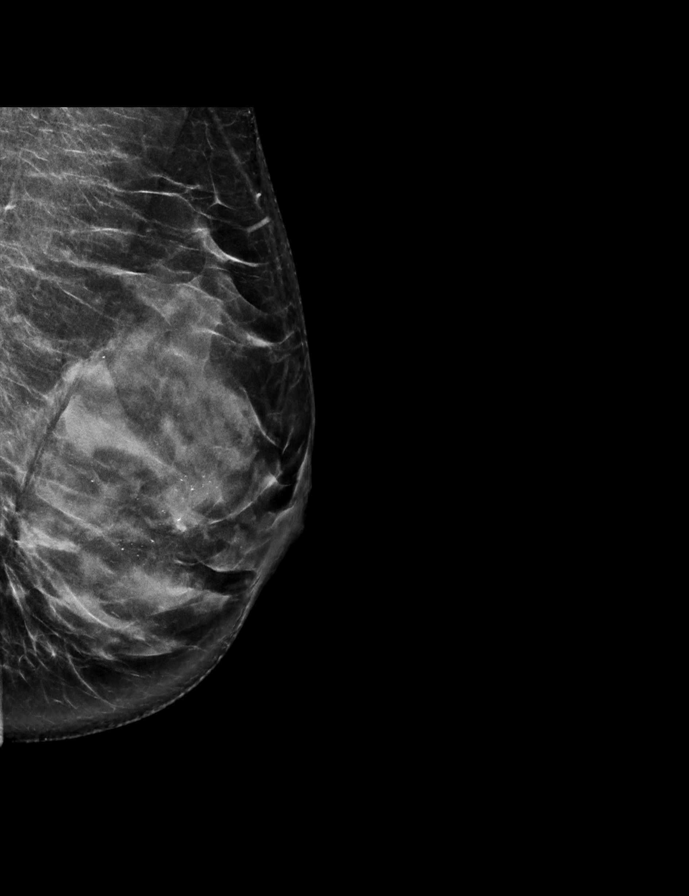

[R MLO synth-2D]
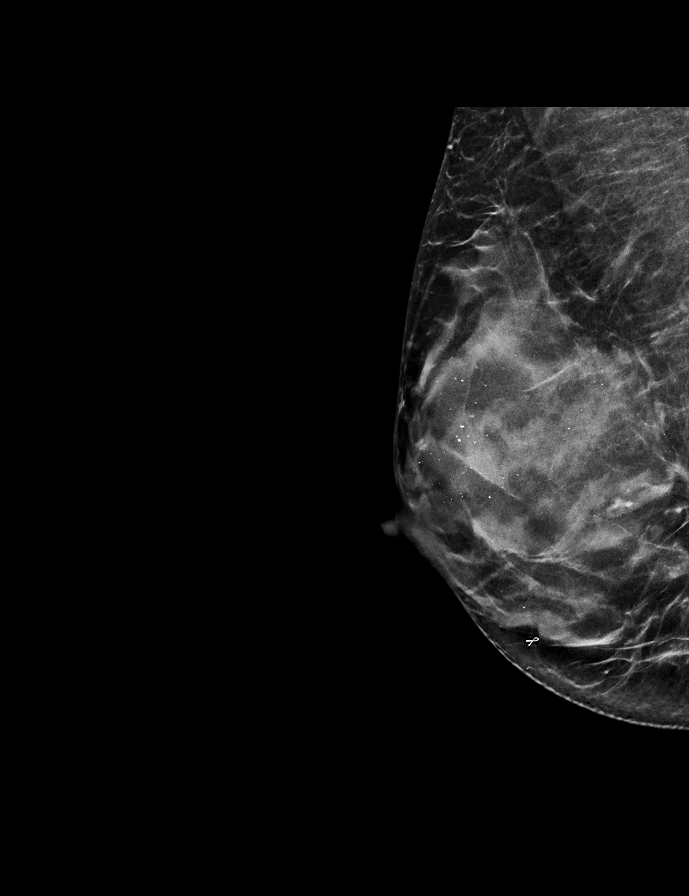

[R CC synth-2D]
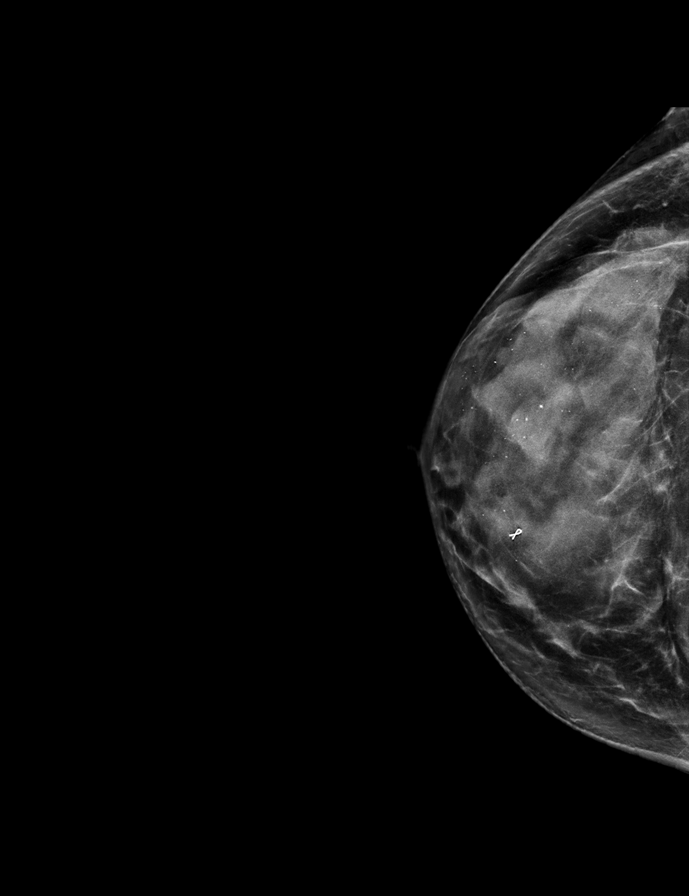

[L CC synth-2D]
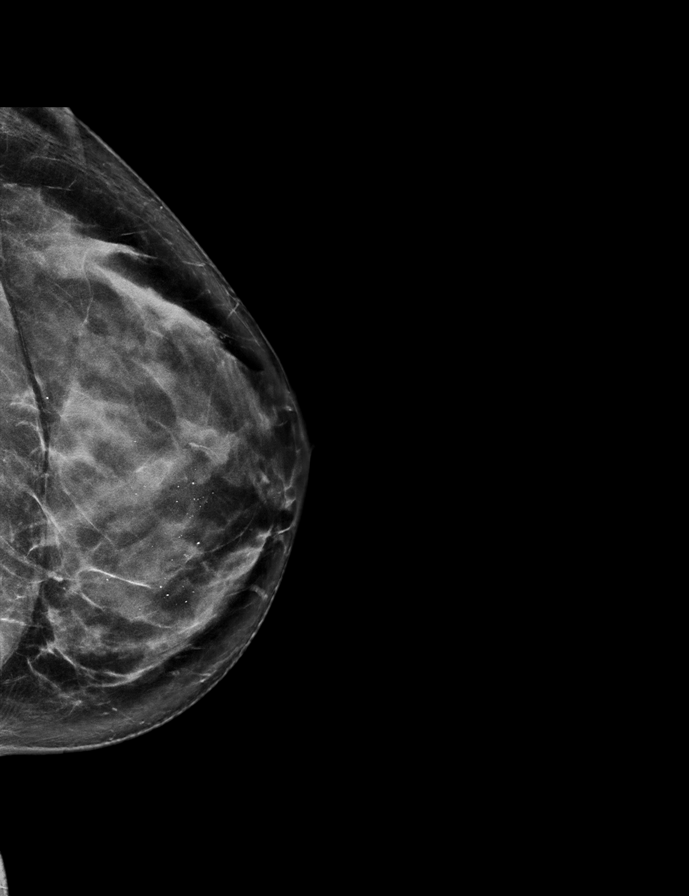

[L CC tomo · 2 of 65 frames shown]
[frame 21/65]
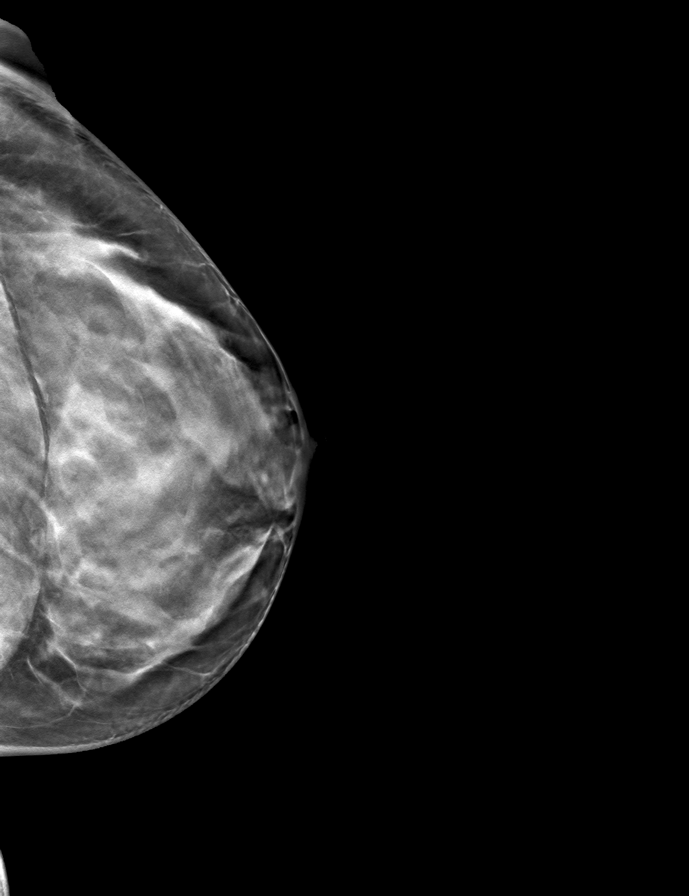
[frame 33/65]
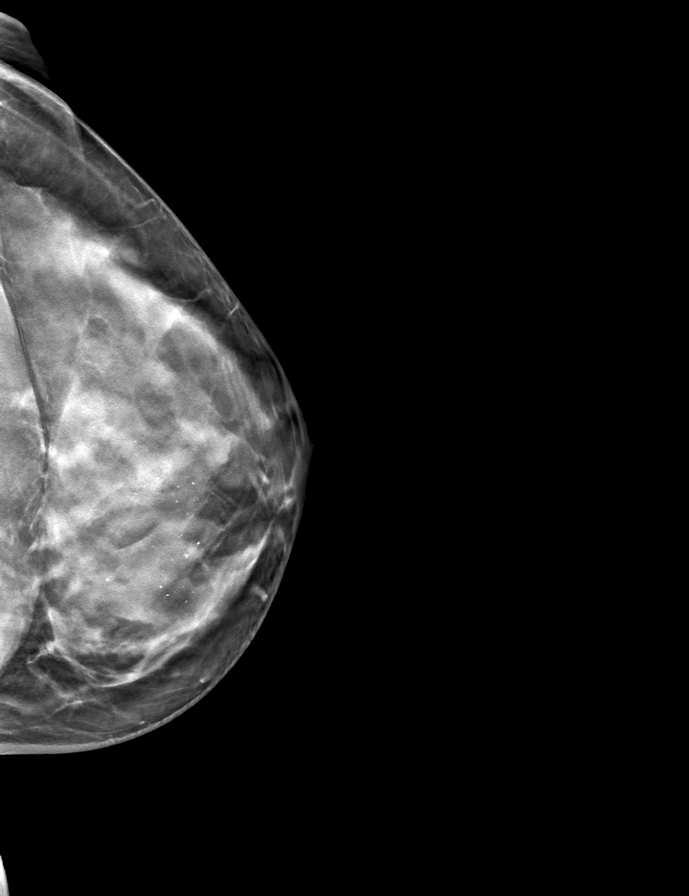

[R CC tomo · tomo slice 31/61.0]
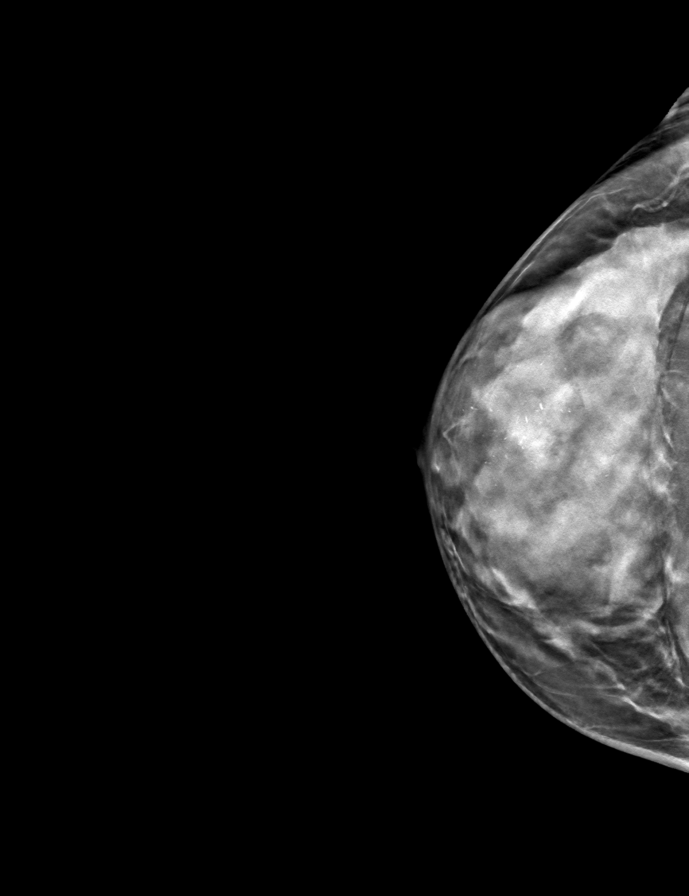

[L MLO tomo · tomo slice 33/66.0]
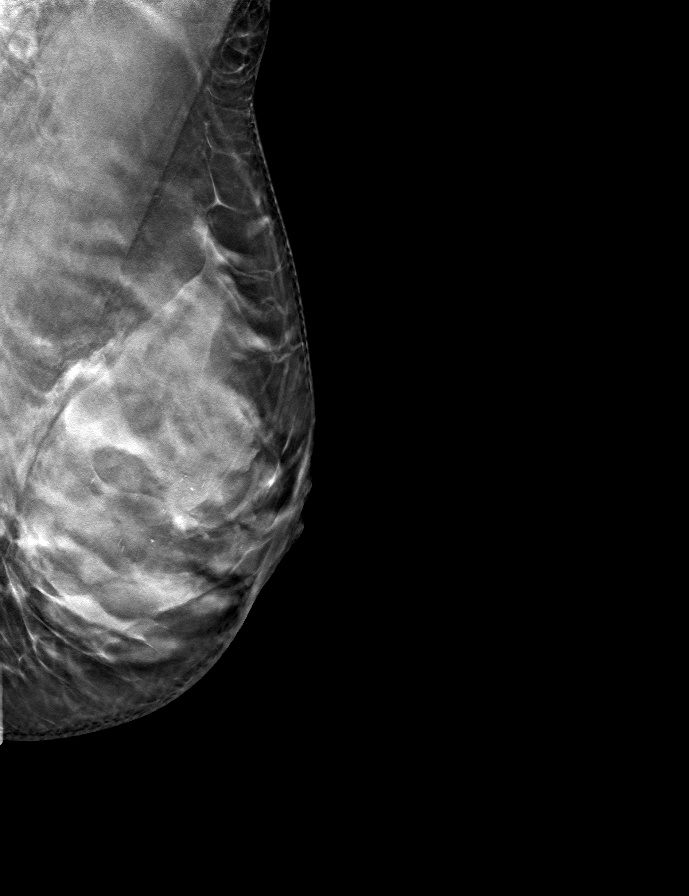

[R MLO tomo · tomo slice 27/54.0]
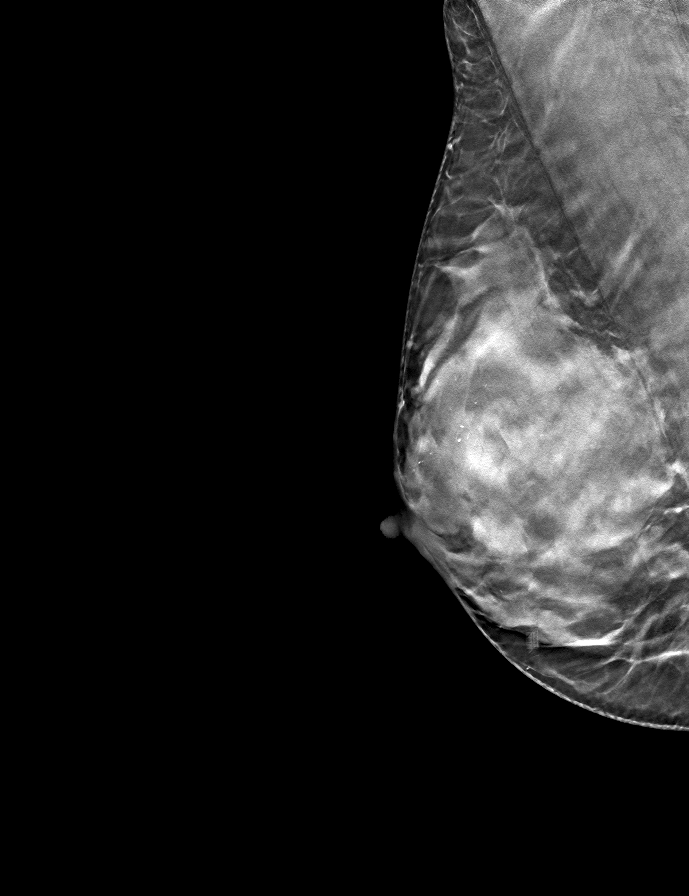

[9 of 24 positions shown; findings below may reference images not displayed]

ACR Breast Density Category d: The breast tissue is extremely dense,
which lowers the sensitivity of mammography
FINDINGS: There are no findings suspicious for malignancy. Images were
processed with CAD.
IMPRESSION: No mammographic evidence of malignancy. A result letter of this
screening mammogram will be mailed directly to the patient.

RECOMMENDATION:
Screening mammogram in one year. (Code:WO-0-ZI0)

BI-RADS CATEGORY  1: Negative.

## 2021-03-29 ENCOUNTER — Other Ambulatory Visit: Payer: Self-pay | Admitting: Physician Assistant

## 2021-03-29 MED ORDER — NITROFURANTOIN MONOHYD MACRO 100 MG PO CAPS
100.0000 mg | ORAL_CAPSULE | Freq: Two times a day (BID) | ORAL | 0 refills | Status: DC
  Filled 2021-03-29: qty 20, 10d supply, fill #0

## 2021-03-30 ENCOUNTER — Other Ambulatory Visit: Payer: Self-pay | Admitting: Physician Assistant

## 2021-03-30 MED ORDER — NITROFURANTOIN MONOHYD MACRO 100 MG PO CAPS: 100.0000 mg | ORAL_CAPSULE | Freq: Two times a day (BID) | ORAL | 0 refills | Status: DC

## 2021-03-31 ENCOUNTER — Other Ambulatory Visit: Payer: Self-pay

## 2021-03-31 ENCOUNTER — Ambulatory Visit (INDEPENDENT_AMBULATORY_CARE_PROVIDER_SITE_OTHER): Payer: 59

## 2021-03-31 DIAGNOSIS — N3001 Acute cystitis with hematuria: Secondary | ICD-10-CM

## 2021-03-31 LAB — POCT URINALYSIS DIP (CLINITEK)
Bilirubin, UA: NEGATIVE
Glucose, UA: NEGATIVE mg/dL
Ketones, POC UA: NEGATIVE mg/dL
Leukocytes, UA: NEGATIVE
Nitrite, UA: NEGATIVE
POC PROTEIN,UA: NEGATIVE
Spec Grav, UA: 1.015 (ref 1.010–1.025)
Urobilinogen, UA: 0.2 E.U./dL
pH, UA: 6 (ref 5.0–8.0)

## 2021-04-01 LAB — URINE CULTURE: Organism ID, Bacteria: NO GROWTH

## 2021-05-29 ENCOUNTER — Encounter: Payer: Self-pay | Admitting: Physician Assistant

## 2021-06-10 DIAGNOSIS — Z803 Family history of malignant neoplasm of breast: Secondary | ICD-10-CM | POA: Diagnosis not present

## 2021-06-10 DIAGNOSIS — R140 Abdominal distension (gaseous): Secondary | ICD-10-CM | POA: Diagnosis not present

## 2021-06-10 DIAGNOSIS — R55 Syncope and collapse: Secondary | ICD-10-CM | POA: Diagnosis not present

## 2021-06-10 DIAGNOSIS — Z1151 Encounter for screening for human papillomavirus (HPV): Secondary | ICD-10-CM | POA: Diagnosis not present

## 2021-06-10 DIAGNOSIS — Z8041 Family history of malignant neoplasm of ovary: Secondary | ICD-10-CM | POA: Diagnosis not present

## 2021-06-10 DIAGNOSIS — Z124 Encounter for screening for malignant neoplasm of cervix: Secondary | ICD-10-CM | POA: Diagnosis not present

## 2021-06-10 DIAGNOSIS — N841 Polyp of cervix uteri: Secondary | ICD-10-CM | POA: Diagnosis not present

## 2021-06-10 DIAGNOSIS — N915 Oligomenorrhea, unspecified: Secondary | ICD-10-CM | POA: Diagnosis not present

## 2021-06-10 DIAGNOSIS — Z1211 Encounter for screening for malignant neoplasm of colon: Secondary | ICD-10-CM | POA: Diagnosis not present

## 2021-06-10 DIAGNOSIS — R300 Dysuria: Secondary | ICD-10-CM | POA: Diagnosis not present

## 2021-06-17 DIAGNOSIS — R140 Abdominal distension (gaseous): Secondary | ICD-10-CM | POA: Diagnosis not present

## 2021-06-20 ENCOUNTER — Ambulatory Visit (INDEPENDENT_AMBULATORY_CARE_PROVIDER_SITE_OTHER): Payer: 59 | Admitting: Physician Assistant

## 2021-06-20 ENCOUNTER — Encounter: Payer: Self-pay | Admitting: Physician Assistant

## 2021-06-20 VITALS — BP 104/62 | HR 58 | Temp 98.1°F | Resp 18 | Wt 143.6 lb

## 2021-06-20 DIAGNOSIS — R1013 Epigastric pain: Secondary | ICD-10-CM

## 2021-06-20 DIAGNOSIS — R110 Nausea: Secondary | ICD-10-CM | POA: Diagnosis not present

## 2021-06-20 DIAGNOSIS — N915 Oligomenorrhea, unspecified: Secondary | ICD-10-CM | POA: Diagnosis not present

## 2021-06-20 DIAGNOSIS — Z8041 Family history of malignant neoplasm of ovary: Secondary | ICD-10-CM | POA: Diagnosis not present

## 2021-06-20 DIAGNOSIS — R55 Syncope and collapse: Secondary | ICD-10-CM | POA: Diagnosis not present

## 2021-06-21 LAB — COMPREHENSIVE METABOLIC PANEL
ALT: 19 IU/L (ref 0–32)
AST: 25 IU/L (ref 0–40)
Albumin/Globulin Ratio: 1.6 (ref 1.2–2.2)
Albumin: 4.5 g/dL (ref 3.8–4.8)
Alkaline Phosphatase: 69 IU/L (ref 44–121)
BUN/Creatinine Ratio: 22 (ref 9–23)
BUN: 18 mg/dL (ref 6–24)
Bilirubin Total: 0.5 mg/dL (ref 0.0–1.2)
CO2: 26 mmol/L (ref 20–29)
Calcium: 9.7 mg/dL (ref 8.7–10.2)
Chloride: 101 mmol/L (ref 96–106)
Creatinine, Ser: 0.82 mg/dL (ref 0.57–1.00)
Globulin, Total: 2.8 g/dL (ref 1.5–4.5)
Glucose: 93 mg/dL (ref 70–99)
Potassium: 4.2 mmol/L (ref 3.5–5.2)
Sodium: 139 mmol/L (ref 134–144)
Total Protein: 7.3 g/dL (ref 6.0–8.5)
eGFR: 90 mL/min/{1.73_m2} (ref >59)

## 2021-06-21 LAB — CBC WITH DIFFERENTIAL/PLATELET
Basophils Absolute: 0 10*3/uL (ref 0.0–0.2)
Basos: 1 %
EOS (ABSOLUTE): 0 10*3/uL (ref 0.0–0.4)
Eos: 0 %
Hematocrit: 43.2 % (ref 34.0–46.6)
Hemoglobin: 14.6 g/dL (ref 11.1–15.9)
Immature Grans (Abs): 0 10*3/uL (ref 0.0–0.1)
Immature Granulocytes: 0 %
Lymphocytes Absolute: 1.4 10*3/uL (ref 0.7–3.1)
Lymphs: 23 %
MCH: 29.9 pg (ref 26.6–33.0)
MCHC: 33.8 g/dL (ref 31.5–35.7)
MCV: 88 fL (ref 79–97)
Monocytes Absolute: 0.4 10*3/uL (ref 0.1–0.9)
Monocytes: 7 %
Neutrophils Absolute: 4.3 10*3/uL (ref 1.4–7.0)
Neutrophils: 69 %
Platelets: 201 10*3/uL (ref 150–450)
RBC: 4.89 x10E6/uL (ref 3.77–5.28)
RDW: 12 % (ref 11.7–15.4)
WBC: 6.2 10*3/uL (ref 3.4–10.8)

## 2021-06-21 LAB — LIPASE: Lipase: 28 U/L (ref 14–72)

## 2021-06-21 LAB — AMYLASE: Amylase: 45 U/L (ref 31–110)

## 2021-06-21 LAB — H. PYLORI BREATH TEST: H pylori Breath Test: NEGATIVE

## 2021-06-22 NOTE — Progress Notes (Signed)
Subjective:  Patient ID: Ashley Porter, female    DOB: 12/05/1975  Age: 46 y.o. MRN: [id number]  Chief Complaint  Patient presents with   GI Problem    HPI  Pt states that since Easter a few months ago she has been having abdominal issues - She had about 2 weeks of abdominal pain, nausea and moderate reflux.  Symptoms calmed after awhile and now is having intermittent epigastric pain associated with belching and occasional nausea.  She has reflux almost daily but currently is not taking any medication She has had a cholycystectomy and a history of Hpylori in the past Has not seen GI but recently also had GYN appt and discussed same symptoms and has been referred to GI but no appt available until end of August Current Outpatient Medications on File Prior to Visit  Medication Sig Dispense Refill   Cholecalciferol (VITAMIN D3) 5000 units TBDP Take by mouth.     Multiple Vitamin (MULTIVITAMIN) tablet Take 1 tablet by mouth daily.     Probiotic Product (PROBIOTIC-10 PO) Take by mouth.     valACYclovir (VALTREX) 1000 MG tablet Take 2000 mgm every 12 hours x 2 doses prn fever blisters 20 tablet 1   No current facility-administered medications on file prior to visit.   Past Medical History:  Diagnosis Date   BRCA negative    Depression    Family history of breast cancer    mom is BRCA positive and pt is BRCA negative   Family history of ovarian cancer    GERD (gastroesophageal reflux disease)    History of mammogram 10/22/2015   Birads 1D had 3D mammo   Melasma    Urge incontinence    Past Surgical History:  Procedure Laterality Date   BREAST BIOPSY Right 2005   neg   BREAST BIOPSY Right 06/2017   Benign fibrocystic changes- PASH   CHOLECYSTECTOMY      Family History  Problem Relation Age of Onset   Breast cancer Mother 16       has BRCA1 mutation   Basal cell carcinoma Mother    Hypertension Father    Ovarian cancer Maternal Grandmother 22   Alzheimer's disease Paternal  Grandmother    Ovarian cancer Cousin 74       maternal second cousin. Maternal second cousin's daughter with ovarian cancer- has BRCA1 mutation   Social History   Socioeconomic History   Marital status: Married    Spouse name: Not on file   Number of children: 2   Years of education: 16   Highest education level: Not on file  Occupational History   Occupation: RN-NICU at [employer]  Tobacco Use   Smoking status: Never   Smokeless tobacco: Never  Vaping Use   Vaping Use: Never used  Substance and Sexual Activity   Alcohol use: Yes    Comment: occasional   Drug use: No   Sexual activity: Yes    Partners: Male    Birth control/protection: Other-see comments    Comment: vasectomy  Other Topics Concern   Not on file  Social History Narrative   Not on file   Social Determinants of Health   Financial Resource Strain: Not on file  Food Insecurity: Not on file  Transportation Needs: Not on file  Physical Activity: Not on file  Stress: Not on file  Social Connections: Not on file    Review of Systems CONSTITUTIONAL: Negative for chills, fatigue, fever, unintentional weight gain and unintentional weight loss.  CARDIOVASCULAR: Negative for chest pain, dizziness, palpitations RESPIRATORY: Negative for recent cough and dyspnea.  GASTROINTESTINAL: see HPI  GU - negative   Objective:  PHYSICAL EXAM:   VS: BP 104/62   Pulse (!) 58   Temp 98.1 F (36.7 C)   Resp 18   Wt 143 lb 9.6 oz (65.1 kg)   SpO2 98%   BMI 24.65 kg/m   GEN: Well nourished, well developed, in no acute distress   Cardiac: RRR; no murmurs, rubs, or gallops,no edema - Respiratory:  normal respiratory rate and pattern with no distress - normal breath sounds with no rales, rhonchi, wheezes or rubs GI: normal bowel sounds, no masses - mild epigastric tenderness   Lab Results  Component Value Date   WBC 6.2 06/20/2021   HGB 14.6 06/20/2021   HCT 43.2 06/20/2021   PLT 201 06/20/2021   GLUCOSE 93  06/20/2021   CHOL 147 01/02/2021   TRIG 68 01/02/2021   HDL 84 01/02/2021   LDLCALC 49 01/02/2021   ALT 19 06/20/2021   AST 25 06/20/2021   NA 139 06/20/2021   K 4.2 06/20/2021   CL 101 06/20/2021   CREATININE 0.82 06/20/2021   BUN 18 06/20/2021   CO2 26 06/20/2021   TSH 3.010 01/02/2021      Assessment & Plan:   Problem List Items Addressed This Visit   None Visit Diagnoses     Epigastric pain    -  Primary   Relevant Orders   CBC with Differential/Platelet (Completed)   Comprehensive metabolic panel (Completed)   Amylase (Completed)   Lipase (Completed)   H. pylori breath test (Completed)   Nausea       Relevant Orders   Amylase (Completed)   Lipase (Completed)  Follow up with GI as scheduled - need for endoscopy (and pt due for screening colonoscopy)     .  No orders of the defined types were placed in this encounter.   Orders Placed This Encounter  Procedures   CBC with Differential/Platelet   Comprehensive metabolic panel   Amylase   Lipase   H. pylori breath test     Follow-up: Return if symptoms worsen or fail to improve.  An After Visit Summary was printed and given to the patient.  SARA R DAVIS, PA-C [facility] [phone] 

## 2021-10-28 ENCOUNTER — Other Ambulatory Visit: Payer: Self-pay | Admitting: Physician Assistant

## 2021-10-28 DIAGNOSIS — Z1231 Encounter for screening mammogram for malignant neoplasm of breast: Secondary | ICD-10-CM

## 2021-11-13 DIAGNOSIS — Z83719 Family history of colon polyps, unspecified: Secondary | ICD-10-CM | POA: Diagnosis not present

## 2021-11-13 DIAGNOSIS — K219 Gastro-esophageal reflux disease without esophagitis: Secondary | ICD-10-CM | POA: Diagnosis not present

## 2021-12-03 DIAGNOSIS — M65872 Other synovitis and tenosynovitis, left ankle and foot: Secondary | ICD-10-CM | POA: Diagnosis not present

## 2021-12-03 DIAGNOSIS — M2012 Hallux valgus (acquired), left foot: Secondary | ICD-10-CM | POA: Diagnosis not present

## 2021-12-03 DIAGNOSIS — M79672 Pain in left foot: Secondary | ICD-10-CM | POA: Diagnosis not present

## 2021-12-31 ENCOUNTER — Ambulatory Visit
Admission: RE | Admit: 2021-12-31 | Discharge: 2021-12-31 | Disposition: A | Discharge status: Home / Self Care | Payer: 59 | Source: Ambulatory Visit | Attending: Physician Assistant | Admitting: Physician Assistant

## 2021-12-31 DIAGNOSIS — Z1231 Encounter for screening mammogram for malignant neoplasm of breast: Secondary | ICD-10-CM

## 2022-01-05 ENCOUNTER — Encounter: Payer: Self-pay | Admitting: Physician Assistant

## 2022-01-05 ENCOUNTER — Ambulatory Visit (INDEPENDENT_AMBULATORY_CARE_PROVIDER_SITE_OTHER): Payer: 59 | Admitting: Physician Assistant

## 2022-01-05 VITALS — BP 110/60 | HR 65 | Temp 97.7°F | Ht 63.5 in | Wt 145.0 lb

## 2022-01-05 DIAGNOSIS — Z0000 Encounter for general adult medical examination without abnormal findings: Secondary | ICD-10-CM | POA: Diagnosis not present

## 2022-01-05 LAB — POCT URINALYSIS DIP (CLINITEK)
Bilirubin, UA: NEGATIVE
Blood, UA: NEGATIVE
Glucose, UA: NEGATIVE mg/dL
Ketones, POC UA: NEGATIVE mg/dL
Leukocytes, UA: NEGATIVE
Nitrite, UA: NEGATIVE
POC PROTEIN,UA: NEGATIVE
Spec Grav, UA: 1.015 (ref 1.010–1.025)
Urobilinogen, UA: NEGATIVE E.U./dL — AB
pH, UA: 6.5 (ref 5.0–8.0)

## 2022-01-05 NOTE — Progress Notes (Signed)
Subjective:  Patient ID: Ashley Porter, female    DOB: 1975-03-07  Age: 46 y.o. MRN: [id number]  Chief Complaint  Patient presents with   Annual Exam    HPI Well Adult Physical: Patient here for a comprehensive physical exam.The patient reports  she has occasional abdominal issues - does have appt with GI in January Do you take any herbs or supplements that were not prescribed by a doctor? yes Are you taking calcium supplements? yes Are you taking aspirin daily? no  Encounter for general adult medical examination without abnormal findings  Physical ("At Risk" items are starred): Patient's last physical exam was 1 year ago .  Patient is not afflicted from Stress Incontinence and Urge Incontinence  Patient wears a seat belt, has smoke detectors, has carbon monoxide detectors, practices appropriate gun safety, and wears sunscreen with extended sun exposure. Dental Care: biannual cleanings, brushes and flosses daily. Ophthalmology/Optometry: Annual visit.  Hearing loss: none Vision impairments: wears contacts   Pregnancy history: G2P2 Safe at home: yes Self breast exams: yes  [place] Office Visit from 01/05/2022 in [place]  PHQ-2 Total Score 0               Social Hx   Social History   Socioeconomic History   Marital status: Married    Spouse name: Not on file   Number of children: 2   Years of education: 16   Highest education level: Not on file  Occupational History   Occupation: RN-NICU at [employer]  Tobacco Use   Smoking status: Never   Smokeless tobacco: Never  Vaping Use   Vaping Use: Never used  Substance and Sexual Activity   Alcohol use: Yes    Comment: occasional   Drug use: No   Sexual activity: Yes    Partners: Male    Birth control/protection: Other-see comments    Comment: vasectomy  Other Topics Concern   Not on file  Social History Narrative   Not on file   Social Determinants of Health   Financial Resource Strain: Not on  file  Food Insecurity: Not on file  Transportation Needs: Not on file  Physical Activity: Not on file  Stress: Not on file  Social Connections: Not on file   Past Medical History:  Diagnosis Date   BRCA negative    Depression    Family history of breast cancer    mom is BRCA positive and pt is BRCA negative   Family history of ovarian cancer    GERD (gastroesophageal reflux disease)    History of mammogram 10/22/2015   Birads 1D had 3D mammo   Melasma    Urge incontinence    Past Surgical History:  Procedure Laterality Date   BREAST BIOPSY Right 2005   neg   BREAST BIOPSY Right 06/2017   Benign fibrocystic changes- PASH   CHOLECYSTECTOMY      Family History  Problem Relation Age of Onset   Breast cancer Mother 74       has BRCA1 mutation   Basal cell carcinoma Mother    Hypertension Father    Ovarian cancer Maternal Grandmother 65   Alzheimer's disease Paternal Grandmother    Ovarian cancer Cousin 37       maternal second cousin. Maternal second cousin's daughter with ovarian cancer- has BRCA1 mutation    Review of Systems CONSTITUTIONAL: Negative for chills, fatigue, fever, unintentional weight gain and unintentional weight loss.  E/N/T: Negative for ear pain, nasal  congestion and sore throat.  CARDIOVASCULAR: Negative for chest pain, dizziness, palpitations and pedal edema.  RESPIRATORY: Negative for recent cough and dyspnea.  GASTROINTESTINAL:see HPI MSK: Negative for arthralgias and myalgias.  INTEGUMENTARY: Negative for rash.  NEUROLOGICAL: Negative for dizziness and headaches.  PSYCHIATRIC: Negative for sleep disturbance and to question depression screen.  Negative for depression, negative for anhedonia.       Objective:  PHYSICAL EXAM:   VS: BP 110/60 (BP Location: Right Arm, Patient Position: Sitting)   Pulse 65   Temp 97.7 F (36.5 C) (Temporal)   Ht 5' 3.5" (1.613 m)   Wt 145 lb (65.8 kg)   SpO2 98%   BMI 25.28 kg/m  Vision Screening   Right  eye Left eye Both eyes  Without correction     With correction _0  Comments: Patient wears contacts   GEN: Well nourished, well developed, in no acute distress  HEENT: normal external ears and nose - normal external auditory canals and TMS - hearing grossly normal- Lips, Teeth and Gums - normal  Oropharynx - normal mucosa, palate, and posterior pharynx Cardiac: RRR; no murmurs, rubs, or gallops,no edema  Respiratory:  normal respiratory rate and pattern with no distress - normal breath sounds with no rales, rhonchi, wheezes or rubs GI: normal bowel sounds, no masses or tenderness MS: no deformity or atrophy  Skin: warm and dry, no rash  Neuro:  Alert and Oriented x 3,- CN II-Xii grossly intact Psych: euthymic mood, appropriate affect and demeanor  Office Visit on 01/05/2022  Component Date Value Ref Range Status   Glucose, UA 01/05/2022 negative  negative mg/dL Final   Bilirubin, UA 01/05/2022 negative  negative Final   Ketones, POC UA 01/05/2022 negative  negative mg/dL Final   Spec Grav, UA 01/05/2022 1.015  1.010 - 1.025 Final   Blood, UA 01/05/2022 negative  negative Final   pH, UA 01/05/2022 6.5  5.0 - 8.0 Final   POC PROTEIN,UA 01/05/2022 negative  negative, trace Final   Urobilinogen, UA 01/05/2022 negative (A)  0.2 or 1.0 E.U./dL Final   Nitrite, UA 01/05/2022 Negative  Negative Final   Leukocytes, UA 01/05/2022 Negative  Negative Final    Office Visit on 01/05/2022  Component Date Value Ref Range Status   Glucose, UA 01/05/2022 negative  negative mg/dL Final   Bilirubin, UA 01/05/2022 negative  negative Final   Ketones, POC UA 01/05/2022 negative  negative mg/dL Final   Spec Grav, UA 01/05/2022 1.015  1.010 - 1.025 Final   Blood, UA 01/05/2022 negative  negative Final   pH, UA 01/05/2022 6.5  5.0 - 8.0 Final   POC PROTEIN,UA 01/05/2022 negative  negative, trace Final   Urobilinogen, UA 01/05/2022 negative (A)  0.2 or 1.0 E.U./dL Final   Nitrite, UA  01/05/2022 Negative  Negative Final   Leukocytes, UA 01/05/2022 Negative  Negative Final    Lab Results  Component Value Date   WBC 6.2 06/20/2021   HGB 14.6 06/20/2021   HCT 43.2 06/20/2021   PLT 201 06/20/2021   GLUCOSE 93 06/20/2021   CHOL 147 01/02/2021   TRIG 68 01/02/2021   HDL 84 01/02/2021   LDLCALC 49 01/02/2021   ALT 19 06/20/2021   AST 25 06/20/2021   NA 139 06/20/2021   K 4.2 06/20/2021   CL 101 06/20/2021   CREATININE 0.82 06/20/2021   BUN 18 06/20/2021   CO2 26 06/20/2021   TSH 3.010 01/02/2021      Assessment &  Plan:   Problem List Items Addressed This Visit   None Visit Diagnoses     Encounter for general adult medical examination without abnormal findings    -  Primary   Relevant Orders   POCT URINALYSIS DIP (CLINITEK) (Completed)   CBC with Differential/Platelet   Comprehensive metabolic panel   Lipid panel   TSH   VITAMIN D 25 Hydroxy (Vit-D Deficiency, Fractures)         Body mass index is 25.28 kg/m.   These are the goals we discussed:  Goals   None      This is a list of the screening recommended for you and due dates:  Health Maintenance  Topic Date Due   DTaP/Tdap/Td vaccine (1 - Tdap) Never done   Pap Smear  11/10/2021   Colon Cancer Screening  02/16/2022*   Mammogram  01/01/2023   Flu Shot  Completed   HPV Vaccine  Aged Out   COVID-19 Vaccine  Discontinued   Hepatitis C Screening: USPSTF Recommendation to screen - Ages 18-79 yo.  Discontinued   HIV Screening  Discontinued  *Topic was postponed. The date shown is not the original due date.     No orders of the defined types were placed in this encounter.   Follow-up: Return in about 1 year (around 01/06/2023) for fasting physical.  An After Visit Summary was printed and given to the patient.  Yetta Flock [facility] [phone]

## 2022-01-09 ENCOUNTER — Other Ambulatory Visit: Payer: 59

## 2022-01-09 DIAGNOSIS — Z0000 Encounter for general adult medical examination without abnormal findings: Secondary | ICD-10-CM

## 2022-01-10 LAB — COMPREHENSIVE METABOLIC PANEL
ALT: 20 IU/L (ref 0–32)
AST: 26 IU/L (ref 0–40)
Albumin/Globulin Ratio: 1.8 (ref 1.2–2.2)
Albumin: 4.4 g/dL (ref 3.9–4.9)
Alkaline Phosphatase: 66 IU/L (ref 44–121)
BUN/Creatinine Ratio: 12 (ref 9–23)
BUN: 13 mg/dL (ref 6–24)
Bilirubin Total: 0.5 mg/dL (ref 0.0–1.2)
CO2: 24 mmol/L (ref 20–29)
Calcium: 9.7 mg/dL (ref 8.7–10.2)
Chloride: 102 mmol/L (ref 96–106)
Creatinine, Ser: 1.05 mg/dL — ABNORMAL HIGH (ref 0.57–1.00)
Globulin, Total: 2.5 g/dL (ref 1.5–4.5)
Glucose: 80 mg/dL (ref 70–99)
Potassium: 4 mmol/L (ref 3.5–5.2)
Sodium: 141 mmol/L (ref 134–144)
Total Protein: 6.9 g/dL (ref 6.0–8.5)
eGFR: 67 mL/min/{1.73_m2} (ref >59)

## 2022-01-10 LAB — CBC WITH DIFFERENTIAL/PLATELET
Basophils Absolute: 0 10*3/uL (ref 0.0–0.2)
Basos: 1 %
EOS (ABSOLUTE): 0 10*3/uL (ref 0.0–0.4)
Eos: 1 %
Hematocrit: 42.5 % (ref 34.0–46.6)
Hemoglobin: 14.2 g/dL (ref 11.1–15.9)
Immature Grans (Abs): 0 10*3/uL (ref 0.0–0.1)
Immature Granulocytes: 0 %
Lymphocytes Absolute: 1.5 10*3/uL (ref 0.7–3.1)
Lymphs: 43 %
MCH: 30.3 pg (ref 26.6–33.0)
MCHC: 33.4 g/dL (ref 31.5–35.7)
MCV: 91 fL (ref 79–97)
Monocytes Absolute: 0.4 10*3/uL (ref 0.1–0.9)
Monocytes: 12 %
Neutrophils Absolute: 1.5 10*3/uL (ref 1.4–7.0)
Neutrophils: 43 %
Platelets: 248 10*3/uL (ref 150–450)
RBC: 4.69 x10E6/uL (ref 3.77–5.28)
RDW: 12.8 % (ref 11.7–15.4)
WBC: 3.5 10*3/uL (ref 3.4–10.8)

## 2022-01-10 LAB — LIPID PANEL
Chol/HDL Ratio: 2 ratio (ref 0.0–4.4)
Cholesterol, Total: 141 mg/dL (ref 100–199)
HDL: 72 mg/dL (ref >39)
LDL Chol Calc (NIH): 55 mg/dL (ref 0–99)
Triglycerides: 73 mg/dL (ref 0–149)
VLDL Cholesterol Cal: 14 mg/dL (ref 5–40)

## 2022-01-10 LAB — VITAMIN D 25 HYDROXY (VIT D DEFICIENCY, FRACTURES): Vit D, 25-Hydroxy: 64.1 ng/mL (ref 30.0–100.0)

## 2022-01-10 LAB — TSH: TSH: 2.64 u[IU]/mL (ref 0.450–4.500)

## 2022-01-10 LAB — CARDIOVASCULAR RISK ASSESSMENT

## 2022-01-14 ENCOUNTER — Encounter: Payer: Self-pay | Admitting: Physician Assistant

## 2022-01-15 ENCOUNTER — Other Ambulatory Visit: Payer: Self-pay | Admitting: Family Medicine

## 2022-01-15 ENCOUNTER — Other Ambulatory Visit: Payer: Self-pay | Admitting: Nurse Practitioner

## 2022-01-15 DIAGNOSIS — B009 Herpesviral infection, unspecified: Secondary | ICD-10-CM

## 2022-01-15 MED ORDER — PENCICLOVIR 1 % EX CREA: 1.0000 | TOPICAL_CREAM | CUTANEOUS | 0 refills | Status: DC

## 2022-01-15 MED ORDER — VALACYCLOVIR HCL 1 G PO TABS: ORAL_TABLET | ORAL | 1 refills | Status: DC

## 2022-02-16 ENCOUNTER — Ambulatory Visit: Payer: 59 | Admitting: Certified Registered"

## 2022-02-16 ENCOUNTER — Encounter
Admission: RE | Disposition: A | Discharge status: Home / Self Care | Payer: Self-pay | Source: Home / Self Care | Attending: Gastroenterology

## 2022-02-16 ENCOUNTER — Ambulatory Visit
Admission: RE | Admit: 2022-02-16 | Discharge: 2022-02-16 | Disposition: A | Discharge status: Home / Self Care | Payer: 59 | Attending: Gastroenterology | Admitting: Gastroenterology

## 2022-02-16 ENCOUNTER — Encounter: Payer: Self-pay | Admitting: *Deleted

## 2022-02-16 DIAGNOSIS — Z1211 Encounter for screening for malignant neoplasm of colon: Secondary | ICD-10-CM | POA: Diagnosis not present

## 2022-02-16 DIAGNOSIS — K640 First degree hemorrhoids: Secondary | ICD-10-CM | POA: Insufficient documentation

## 2022-02-16 HISTORY — PX: COLONOSCOPY WITH PROPOFOL: SHX5780

## 2022-02-16 LAB — POCT PREGNANCY, URINE: Preg Test, Ur: NEGATIVE

## 2022-02-16 SURGERY — COLONOSCOPY WITH PROPOFOL
Anesthesia: General

## 2022-02-16 MED ORDER — PROPOFOL 500 MG/50ML IV EMUL
INTRAVENOUS | Status: DC | PRN
  Administered 2022-02-16: 150 ug/kg/min via INTRAVENOUS

## 2022-02-16 MED ORDER — LIDOCAINE HCL (CARDIAC) PF 100 MG/5ML IV SOSY
PREFILLED_SYRINGE | INTRAVENOUS | Status: DC | PRN
  Administered 2022-02-16: 50 mg via INTRAVENOUS

## 2022-02-16 MED ORDER — SODIUM CHLORIDE 0.9 % IV SOLN: INTRAVENOUS | Status: DC

## 2022-02-16 MED ORDER — PROPOFOL 1000 MG/100ML IV EMUL
INTRAVENOUS | Status: AC
  Filled 2022-02-16: qty 100

## 2022-02-16 MED ORDER — PROPOFOL 10 MG/ML IV BOLUS
INTRAVENOUS | Status: DC | PRN
  Administered 2022-02-16: 70 mg via INTRAVENOUS
  Administered 2022-02-16 (×3): 20 mg via INTRAVENOUS

## 2022-02-16 MED ORDER — GLYCOPYRROLATE 0.2 MG/ML IJ SOLN
INTRAMUSCULAR | Status: DC | PRN
  Administered 2022-02-16: .2 mg via INTRAVENOUS

## 2022-02-16 NOTE — H&P (Signed)
Outpatient short stay form Pre-procedure 02/16/2022  Ashley Rubenstein, MD  Primary Physician: Marge Duncans, PA-C  Reason for visit:  Screening colonoscopy  History of present illness:    47 y/o lady with no significant medical history except cholecystectomy here for index screening colonoscopy. No blood thinners. No family history of GI malignancies.    Current Facility-Administered Medications:    0.9 %  sodium chloride infusion, , Intravenous, Continuous, Nishawn Rotan, Hilton Cork, MD, Last Rate: 20 mL/hr at 02/16/22 0721, New Bag at 02/16/22 0721  Medications Prior to Admission  Medication Sig Dispense Refill Last Dose   MAGNESIUM GLYCINATE PO    Past Week   Multiple Vitamin (MULTIVITAMIN) tablet Take 1 tablet by mouth daily.   Past Week   Biotin 1 MG CAPS Take 1 capsule by mouth daily.      Cholecalciferol (VITAMIN D3) 5000 units TBDP Take by mouth.      Cod Liver Oil 1000 MG CAPS       CRANBERRY-VITAMIN C-D MANNOSE PO       etodolac (LODINE) 500 MG tablet       penciclovir (DENAVIR) 1 % cream Apply 1 Application topically every 2 (two) hours. 1.5 g 0    Probiotic Product (PROBIOTIC-10 PO) Take by mouth.      valACYclovir (VALTREX) 1000 MG tablet Take 2000 mgm every 12 hours x 2 doses prn fever blisters 20 tablet 1      No Known Allergies   Past Medical History:  Diagnosis Date   BRCA negative    Family history of breast cancer    mom is BRCA positive and pt is BRCA negative   Family history of ovarian cancer    GERD (gastroesophageal reflux disease)    History of mammogram 10/22/2015   Birads 1D had 3D mammo   Melasma    Urge incontinence     Review of systems:  Otherwise negative.    Physical Exam  Gen: Alert, oriented. Appears stated age.  HEENT: PERRLA. Lungs: No respiratory distress CV: RRR Abd: soft, benign, no masses Ext: No edema    Planned procedures: Proceed with colonoscopy. The patient understands the nature of the planned procedure,  indications, risks, alternatives and potential complications including but not limited to bleeding, infection, perforation, damage to internal organs and possible oversedation/side effects from anesthesia. The patient agrees and gives consent to proceed.  Please refer to procedure notes for findings, recommendations and patient disposition/instructions.     Ashley Rubenstein, MD [facility]

## 2022-02-16 NOTE — Anesthesia Preprocedure Evaluation (Signed)
Anesthesia Evaluation  Patient identified by MRN, date of birth, ID band Patient awake    Reviewed: Allergy & Precautions, NPO status , Patient's Chart, lab work & pertinent test results  History of Anesthesia Complications Negative for: history of anesthetic complications  Airway Mallampati: II  TM Distance: >3 FB Neck ROM: full    Dental  (+) Dental Advidsory Given   Pulmonary neg pulmonary ROS, neg shortness of breath   Pulmonary exam normal        Cardiovascular (-) hypertension(-) Past MI negative cardio ROS Normal cardiovascular exam     Neuro/Psych negative neurological ROS  negative psych ROS   GI/Hepatic negative GI ROS, Neg liver ROS,,,  Endo/Other  negative endocrine ROS    Renal/GU negative Renal ROS  negative genitourinary   Musculoskeletal   Abdominal   Peds  Hematology negative hematology ROS (+)   Anesthesia Other Findings Past Medical History: No date: BRCA negative No date: Family history of breast cancer     Comment:  mom is BRCA positive and pt is BRCA negative No date: Family history of ovarian cancer No date: GERD (gastroesophageal reflux disease) 10/22/2015: History of mammogram     Comment:  Birads 1D had 3D mammo No date: Melasma No date: Urge incontinence  Past Surgical History: 2005: BREAST BIOPSY; Right     Comment:  neg 06/2017: BREAST BIOPSY; Right     Comment:  Benign fibrocystic changes- PASH No date: CHOLECYSTECTOMY  BMI    Body Mass Index: 25.42 kg/m      Reproductive/Obstetrics negative OB ROS                             Anesthesia Physical Anesthesia Plan  ASA: 1  Anesthesia Plan: General   Post-op Pain Management: Minimal or no pain anticipated   Induction: Intravenous  PONV Risk Score and Plan: 3 and Propofol infusion, TIVA and Ondansetron  Airway Management Planned: Nasal Cannula  Additional Equipment: None  Intra-op  Plan:   Post-operative Plan:   Informed Consent: I have reviewed the patients History and Physical, chart, labs and discussed the procedure including the risks, benefits and alternatives for the proposed anesthesia with the patient or authorized representative who has indicated his/her understanding and acceptance.     Dental advisory given  Plan Discussed with: CRNA and Surgeon  Anesthesia Plan Comments: (Discussed risks of anesthesia with patient, including possibility of difficulty with spontaneous ventilation under anesthesia necessitating airway intervention, PONV, and rare risks such as cardiac or respiratory or neurological events, and allergic reactions. Discussed the role of CRNA in patient's perioperative care. Patient understands.)        Anesthesia Quick Evaluation

## 2022-02-16 NOTE — Anesthesia Procedure Notes (Signed)
Procedure Name: MAC Date/Time: 02/16/2022 7:48 AM  Performed by: Biagio Borg, CRNAPre-anesthesia Checklist: Patient identified, Emergency Drugs available, Suction available, Patient being monitored and Timeout performed Patient Re-evaluated:Patient Re-evaluated prior to induction Oxygen Delivery Method: Nasal cannula Induction Type: IV induction Placement Confirmation: positive ETCO2 and CO2 detector

## 2022-02-16 NOTE — Interval H&P Note (Signed)
History and Physical Interval Note:  02/16/2022 7:46 AM  Ashley Porter  has presented today for surgery, with the diagnosis of family hx of polyp in the colon.  The various methods of treatment have been discussed with the patient and family. After consideration of risks, benefits and other options for treatment, the patient has consented to  Procedure(s): COLONOSCOPY WITH PROPOFOL (N/A) as a surgical intervention.  The patient's history has been reviewed, patient examined, no change in status, stable for surgery.  I have reviewed the patient's chart and labs.  Questions were answered to the patient's satisfaction.     Lesly Rubenstein  Ok to proceed with colonoscopy

## 2022-02-16 NOTE — Anesthesia Postprocedure Evaluation (Signed)
Anesthesia Post Note  Patient: Ashley Porter  Procedure(s) Performed: COLONOSCOPY WITH PROPOFOL  Patient location during evaluation: Endoscopy Anesthesia Type: General Level of consciousness: awake and alert Pain management: pain level controlled Vital Signs Assessment: post-procedure vital signs reviewed and stable Respiratory status: spontaneous breathing, nonlabored ventilation, respiratory function stable and patient connected to nasal cannula oxygen Cardiovascular status: blood pressure returned to baseline and stable Postop Assessment: no apparent nausea or vomiting Anesthetic complications: no  No notable events documented.   Last Vitals:  Vitals:   02/16/22 0814 02/16/22 0824  BP: (!) 83/57 97/73  Pulse:    Resp:    Temp: (!) 35.8 C   SpO2:      Last Pain:  Vitals:   02/16/22 0824  TempSrc:   PainSc: 0-No pain                 Dimas Millin

## 2022-02-16 NOTE — Op Note (Signed)
[facility] Patient Name: Ashley Porter Procedure Date: 02/16/2022 7:15 AM MRN: [id number] Account #: [id number] Date of Birth: Jun 09, 1975 Admit Type: Outpatient Age: 47 Room: [facility] ENDO ROOM 3 Gender: Female Note Status: Supervisor Override Instrument Name: Jasper Riling [id number] Procedure:             Colonoscopy Indications:           Screening for colorectal malignant neoplasm, Family                         history of colon polyps Providers:             Andrey Farmer MD, MD Referring MD:          Everlean Alstrom. Rosana Hoes (Referring MD) Medicines:             Monitored Anesthesia Care Complications:         No immediate complications. Procedure:             Pre-Anesthesia Assessment:                        - Prior to the procedure, a History and Physical was                         performed, and patient medications and allergies were                         reviewed. The patient is competent. The risks and                         benefits of the procedure and the sedation options and                         risks were discussed with the patient. All questions                         were answered and informed consent was obtained.                         Patient identification and proposed procedure were                         verified by the physician, the nurse, the                         anesthesiologist, the anesthetist and the technician                         in the endoscopy suite. Mental Status Examination:                         alert and oriented. Airway Examination: normal                         oropharyngeal airway and neck mobility. Respiratory                         Examination: clear to auscultation. CV Examination:  normal. Prophylactic Antibiotics: The patient does not                         require prophylactic antibiotics. Prior                         Anticoagulants: The patient has taken no anticoagulant                          or antiplatelet agents. ASA Grade Assessment: I - A                         normal, healthy patient. After reviewing the risks and                         benefits, the patient was deemed in satisfactory                         condition to undergo the procedure. The anesthesia                         plan was to use monitored anesthesia care (MAC).                         Immediately prior to administration of medications,                         the patient was re-assessed for adequacy to receive                         sedatives. The heart rate, respiratory rate, oxygen                         saturations, blood pressure, adequacy of pulmonary                         ventilation, and response to care were monitored                         throughout the procedure. The physical status of the                         patient was re-assessed after the procedure.                        After obtaining informed consent, the colonoscope was                         passed under direct vision. Throughout the procedure,                         the patient's blood pressure, pulse, and oxygen                         saturations were monitored continuously. The                         Colonoscope was introduced through the anus and  advanced to the the cecum, identified by appendiceal                         orifice and ileocecal valve. The colonoscopy was                         somewhat difficult due to significant looping.                         Successful completion of the procedure was aided by                         changing the patient to a supine position. The patient                         tolerated the procedure well. The quality of the bowel                         preparation was good. The ileocecal valve, appendiceal                         orifice, and rectum were photographed. Findings:      The perianal and digital rectal examinations  were normal.      Internal hemorrhoids were found during retroflexion. The hemorrhoids       were Grade I (internal hemorrhoids that do not prolapse).      The exam was otherwise without abnormality on direct and retroflexion       views. Impression:            - Internal hemorrhoids.                        - The examination was otherwise normal on direct and                         retroflexion views.                        - No specimens collected. Recommendation:        - Discharge patient to home.                        - Resume previous diet.                        - Continue present medications.                        - Repeat colonoscopy in 10 years for screening                         purposes.                        - Return to referring physician as previously                         scheduled. Procedure Code(s):     --- Professional ---  M8413, Colorectal cancer screening; colonoscopy on                         individual not meeting criteria for high risk Diagnosis Code(s):     --- Professional ---                        Z12.11, Encounter for screening for malignant neoplasm                         of colon                        K64.0, First degree hemorrhoids CPT copyright 2022 American Medical Association. All rights reserved. The codes documented in this report are preliminary and upon coder review may  be revised to meet current compliance requirements. Andrey Farmer MD, MD 02/16/2022 8:13:15 AM Number of Addenda: 0 Note Initiated On: 02/16/2022 7:15 AM Scope Withdrawal Time: 0 hours 9 minutes 53 seconds  Total Procedure Duration: 0 hours 17 minutes 46 seconds  Estimated Blood Loss:  Estimated blood loss: none.      [facility]

## 2022-02-16 NOTE — Transfer of Care (Signed)
Immediate Anesthesia Transfer of Care Note  Patient: Ashley Porter  Procedure(s) Performed: COLONOSCOPY WITH PROPOFOL  Patient Location: PACU and Endoscopy Unit  Anesthesia Type:General  Level of Consciousness: drowsy  Airway & Oxygen Therapy: Patient Spontanous Breathing  Post-op Assessment: Report given to RN and Post -op Vital signs reviewed and stable  Post vital signs: Reviewed and stable  Last Vitals:  Vitals Value Taken Time  BP    Temp    Pulse 73 02/16/22 0814  Resp 12 02/16/22 0814  SpO2 100 % 02/16/22 0814  Vitals shown include unvalidated device data.  Last Pain:  Vitals:   02/16/22 0704  TempSrc: Temporal         Complications: No notable events documented.

## 2022-02-17 ENCOUNTER — Encounter: Payer: Self-pay | Admitting: Gastroenterology

## 2022-08-06 DIAGNOSIS — M2012 Hallux valgus (acquired), left foot: Secondary | ICD-10-CM | POA: Diagnosis not present

## 2022-08-06 DIAGNOSIS — M65872 Other synovitis and tenosynovitis, left ankle and foot: Secondary | ICD-10-CM | POA: Diagnosis not present

## 2022-08-06 DIAGNOSIS — M79672 Pain in left foot: Secondary | ICD-10-CM | POA: Diagnosis not present

## 2022-08-26 DIAGNOSIS — Z1331 Encounter for screening for depression: Secondary | ICD-10-CM | POA: Diagnosis not present

## 2022-08-26 DIAGNOSIS — Z01419 Encounter for gynecological examination (general) (routine) without abnormal findings: Secondary | ICD-10-CM | POA: Diagnosis not present

## 2022-08-26 DIAGNOSIS — Z8041 Family history of malignant neoplasm of ovary: Secondary | ICD-10-CM | POA: Diagnosis not present

## 2022-08-26 DIAGNOSIS — Z124 Encounter for screening for malignant neoplasm of cervix: Secondary | ICD-10-CM | POA: Diagnosis not present

## 2022-08-26 DIAGNOSIS — Z803 Family history of malignant neoplasm of breast: Secondary | ICD-10-CM | POA: Diagnosis not present

## 2022-08-26 DIAGNOSIS — Z1151 Encounter for screening for human papillomavirus (HPV): Secondary | ICD-10-CM | POA: Diagnosis not present

## 2022-10-28 ENCOUNTER — Encounter: Payer: Self-pay | Admitting: Physician Assistant

## 2022-10-29 ENCOUNTER — Other Ambulatory Visit: Payer: Self-pay | Admitting: Physician Assistant

## 2022-10-29 MED ORDER — VALACYCLOVIR HCL 1 G PO TABS: ORAL_TABLET | ORAL | 1 refills | Status: AC

## 2022-11-17 ENCOUNTER — Other Ambulatory Visit: Payer: Self-pay

## 2022-11-17 DIAGNOSIS — Z1231 Encounter for screening mammogram for malignant neoplasm of breast: Secondary | ICD-10-CM

## 2023-01-06 ENCOUNTER — Ambulatory Visit
Admission: RE | Admit: 2023-01-06 | Discharge: 2023-01-06 | Disposition: A | Discharge status: Home / Self Care | Payer: 59 | Source: Ambulatory Visit

## 2023-01-06 DIAGNOSIS — Z1231 Encounter for screening mammogram for malignant neoplasm of breast: Secondary | ICD-10-CM

## 2023-02-10 ENCOUNTER — Ambulatory Visit: Admit: 2023-02-10 | Payer: 59 | Admitting: Podiatry

## 2023-02-10 SURGERY — BUNIONECTOMY
Anesthesia: Choice | Site: Toe | Laterality: Left

## 2023-03-08 DIAGNOSIS — H5213 Myopia, bilateral: Secondary | ICD-10-CM | POA: Diagnosis not present

## 2023-09-15 DIAGNOSIS — E28319 Asymptomatic premature menopause: Secondary | ICD-10-CM | POA: Diagnosis not present

## 2023-09-15 DIAGNOSIS — Z0000 Encounter for general adult medical examination without abnormal findings: Secondary | ICD-10-CM | POA: Diagnosis not present

## 2023-09-21 DIAGNOSIS — Z1151 Encounter for screening for human papillomavirus (HPV): Secondary | ICD-10-CM | POA: Diagnosis not present

## 2023-09-21 DIAGNOSIS — Z1231 Encounter for screening mammogram for malignant neoplasm of breast: Secondary | ICD-10-CM | POA: Diagnosis not present

## 2023-09-21 DIAGNOSIS — Z1331 Encounter for screening for depression: Secondary | ICD-10-CM | POA: Diagnosis not present

## 2023-09-21 DIAGNOSIS — N951 Menopausal and female climacteric states: Secondary | ICD-10-CM | POA: Diagnosis not present

## 2023-09-21 DIAGNOSIS — Z124 Encounter for screening for malignant neoplasm of cervix: Secondary | ICD-10-CM | POA: Diagnosis not present

## 2023-09-21 DIAGNOSIS — Z01411 Encounter for gynecological examination (general) (routine) with abnormal findings: Secondary | ICD-10-CM | POA: Diagnosis not present

## 2023-10-04 ENCOUNTER — Ambulatory Visit

## 2023-10-04 ENCOUNTER — Ambulatory Visit: Payer: Self-pay

## 2023-10-04 NOTE — Telephone Encounter (Signed)
  Appointment at 4pm today 10/04/2023 with Dr Mamatha Sirivol  FYI Only or Action Required?: FYI only for provider.  Patient was last seen in primary care on 01/05/2022 by Nicholaus Credit, PA-C.  Called Nurse Triage reporting Allergic Reaction.  Symptoms began yesterday.  Interventions attempted: Rest, hydration, or home remedies.  Symptoms are: unchanged.  Triage Disposition: See HCP Within 4 Hours (Or PCP Triage)  Patient/caregiver understands and will follow disposition?: Yes        Copied from CRM #[id number]. Topic: Clinical - Red Word Triage >> Oct 04, 2023 10:54 AM Larissa RAMAN wrote: [facility] that prompted transfer to Nurse Triage: allergic reaction- hives, eye swelling Reason for Disposition  Widespread rash on body  Answer Assessment - Initial Assessment Questions Patient states that the hives are unchanged She also states that there is some swelling around her eyes She denies any swelling in her throat, swelling of the tongue/lips, chest pain, difficulty breathing, or airway compromise at this time Patient wanted to see her PCP Credit Nicholaus but there were no available appointments. This RN called [facility] and verified that Credit Nicholaus had nothing available today  Patient states that she has a high deductible plan and she is very hesitant about going to Urgent Care due to the cost  Patient states that she would like to book this appointment for 4pm but if she has to change it or goes somewhere else she will call us  back Patient states that she will wait until 4pm unless something worsens She is unsure what she may have came in contact with that may have caused this to start yesterday  Patient is advised that if anything worsens to go to the Emergency Room. Patient verbalized understanding.     1. ONSET: When did the swelling start? (e.g., minutes, hours, days)     yesterday 2. LOCATION: What part of the face is swollen? (e.g., cheek, entire face, jaw joint area, under  jaw)     Around eyes 3. SEVERITY: How swollen is it?     ----- 4. ITCHING: Is there any itching? If Yes, ask: How much?   (Scale 1-10; mild, moderate or severe)     Patient taken benadryl 5. PAIN: Is the swelling painful to touch? If Yes, ask: How painful is it?   (Scale 0-10; mild, moderate or severe)     ----- 6. FEVER: Do you have a fever? If Yes, ask: What is it, how was it measured, and when did it start?      no 7. CAUSE: What do you think is causing the face swelling?     unsure 8. NEW MEDICINES: Have there been any new medicines started recently?     no 9. RECURRENT SYMPTOM: Have you had face swelling before? If Yes, ask: When was the last time? What happened that time?     no 10. OTHER SYMPTOMS: Do you have any other symptoms? (e.g., leg swelling, toothache)       Swelling around eyes 11. PREGNANCY: Is there any chance you are pregnant? When was your last menstrual period?       -----  Protocols used: Face Swelling-A-AH

## 2023-10-05 ENCOUNTER — Other Ambulatory Visit: Payer: Self-pay

## 2023-10-05 ENCOUNTER — Encounter: Payer: Self-pay | Admitting: Physician Assistant

## 2023-10-05 ENCOUNTER — Ambulatory Visit: Admitting: Physician Assistant

## 2023-10-05 VITALS — BP 106/60 | HR 58 | Temp 97.5°F | Resp 18 | Ht 63.5 in | Wt 147.0 lb

## 2023-10-05 DIAGNOSIS — N3000 Acute cystitis without hematuria: Secondary | ICD-10-CM | POA: Diagnosis not present

## 2023-10-05 DIAGNOSIS — L509 Urticaria, unspecified: Secondary | ICD-10-CM | POA: Diagnosis not present

## 2023-10-05 LAB — POCT URINALYSIS DIP (CLINITEK)
Bilirubin, UA: NEGATIVE
Blood, UA: NEGATIVE
Glucose, UA: NEGATIVE mg/dL
Ketones, POC UA: NEGATIVE mg/dL
Leukocytes, UA: NEGATIVE
Nitrite, UA: NEGATIVE
POC PROTEIN,UA: NEGATIVE
Spec Grav, UA: 1.01 (ref 1.010–1.025)
Urobilinogen, UA: 0.2 U/dL
pH, UA: 6 (ref 5.0–8.0)

## 2023-10-05 MED ORDER — TRIAMCINOLONE ACETONIDE 40 MG/ML IJ SUSP
60.0000 mg | Freq: Once | INTRAMUSCULAR | Status: AC
  Administered 2023-10-05: 60 mg via INTRAMUSCULAR

## 2023-10-05 MED ORDER — PREDNISONE 20 MG PO TABS
ORAL_TABLET | ORAL | 0 refills | Status: DC
  Filled 2023-10-05: qty 9, 6d supply, fill #0

## 2023-10-05 NOTE — Progress Notes (Deleted)
hive

## 2023-10-05 NOTE — Progress Notes (Signed)
 Acute Office Visit  Subjective:    Patient ID: Ashley Porter, female    DOB: [birth year], 48 y.o.   MRN: [id number]  Chief Complaint  Patient presents with   Facial Swelling    HPI: Patient is in today for complaints that on Sunday she began having pruritus and started breaking out in rash - on face, arms, lower abdomen/groin area, upper inner thighs.  She did take zyrtec but did not resolve symptoms.  She also had issues with head itching/rash, facial swelling and eyelids swollen.  She had a provider she works with call in prednisone  and started that medication yesterday at 60mg  qd  The only new medication she is taking is progesterone that was started 3 weeks ago She has not eaten any new foods she is aware of Has never had hives in past   Pt had recent uti on 9/3 and given macrobid  which she completed last week - denies any urine symptoms today  Current Outpatient Medications:    Cholecalciferol (VITAMIN D3) 5000 units TBDP, Take by mouth., Disp: , Rfl:    Cod Liver Oil 1000 MG CAPS, , Disp: , Rfl:    CRANBERRY-VITAMIN C-D MANNOSE PO, , Disp: , Rfl:    etodolac  (LODINE ) 500 MG tablet, , Disp: , Rfl:    MAGNESIUM GLYCINATE PO, , Disp: , Rfl:    Multiple Vitamin (MULTIVITAMIN) tablet, Take 1 tablet by mouth daily., Disp: , Rfl:    nitrofurantoin , macrocrystal-monohydrate, (MACROBID ) 100 MG capsule, Take 100 mg by mouth 2 (two) times daily., Disp: , Rfl:    predniSONE  (DELTASONE ) 20 MG tablet, Take 1 tablet (20 mg total) by mouth 2 (two) times daily for 3 days, THEN 1 tablet (20 mg total) daily for 3 days., Disp: 9 tablet, Rfl: 0   Probiotic Product (PROBIOTIC-10 PO), Take by mouth., Disp: , Rfl:    progesterone (PROMETRIUM) 100 MG capsule, Take 100 mg by mouth., Disp: , Rfl:    valACYclovir  (VALTREX ) 1000 MG tablet, Take 2000 mgm every 12 hours x 2 doses prn fever blisters, Disp: 20 tablet, Rfl: 1  No Known Allergies  ROS CONSTITUTIONAL: Negative for chills, fatigue, fever,   E/N/T: Negative for ear pain, nasal congestion and sore throat.  CARDIOVASCULAR: Negative for chest pain,  RESPIRATORY: Negative for recent cough and dyspnea.  GASTROINTESTINAL: Negative for abdominal pain, acid reflux symptoms, constipation, diarrhea, nausea and vomiting.   INTEGUMENTARY: see HPI      Objective:    PHYSICAL EXAM:   BP 106/60   Pulse (!) 58   Temp (!) 97.5 F (36.4 C) (Temporal)   Resp 18   Ht 5' 3.5 (1.613 m)   Wt 147 lb (66.7 kg)   SpO2 99%   BMI 25.63 kg/m    GEN: Well nourished, well developed, in no acute distress  Cardiac: RRR; no murmurs, Respiratory:  normal respiratory rate and pattern with no distress - normal breath sounds with no rales, rhonchi, wheezes or rubs  Skin: hives noted on back of neck and back of ears, torso, groin area Neuro:  Alert and Oriented x 3, - CN II-Xii grossly intact Psych: euthymic mood, appropriate affect and demeanor     Office Visit on 10/05/2023  Component Date Value Ref Range Status   Color, UA 10/05/2023 yellow  yellow Final   Clarity, UA 10/05/2023 clear  clear Final   Glucose, UA 10/05/2023 negative  negative mg/dL Final   Bilirubin, UA 90/83/7974 negative  negative Final   Ketones,  POC UA 10/05/2023 negative  negative mg/dL Final   Spec Grav, UA 90/83/7974 1.010  1.010 - 1.025 Final   Blood, UA 10/05/2023 negative  negative Final   pH, UA 10/05/2023 6.0  5.0 - 8.0 Final   POC PROTEIN,UA 10/05/2023 negative  negative, trace Final   Urobilinogen, UA 10/05/2023 0.2  0.2 or 1.0 E.U./dL Final   Nitrite, UA 90/83/7974 Negative  Negative Final   Leukocytes, UA 10/05/2023 Negative  Negative Final    Assessment & Plan:    Hives -     Triamcinolone  Acetonide 60 mg IM -     CBC with Differential/Platelet -     Comprehensive metabolic panel with GFR -     Alpha-Gal Panel -     predniSONE ; Take 1 tablet (20 mg total) by mouth 2 (two) times daily for 3 days, THEN 1 tablet (20 mg total) daily for 3 days.   Dispense: 9 tablet; Refill: 0 After finishing the 60mg  daily dose for 5 days Acute cystitis without hematuria - resolved -     POCT URINALYSIS DIP (CLINITEK)     Follow-up: Return if symptoms worsen or fail to improve.  An After Visit Summary was printed and given to the patient.  [facility] [phone]

## 2023-10-06 ENCOUNTER — Other Ambulatory Visit: Payer: Self-pay | Admitting: Physician Assistant

## 2023-10-06 ENCOUNTER — Encounter: Payer: Self-pay | Admitting: Physician Assistant

## 2023-10-06 DIAGNOSIS — L509 Urticaria, unspecified: Secondary | ICD-10-CM

## 2023-10-06 MED ORDER — PREDNISONE 20 MG PO TABS: ORAL_TABLET | ORAL | 0 refills | Status: AC

## 2023-10-06 NOTE — Telephone Encounter (Unsigned)
 Copied from CRM #[id number]. Topic: Clinical - Medication Refill >> Oct 06, 2023  1:13 PM Zebedee SAUNDERS wrote: Medication: predniSONE  (DELTASONE ) 20 MG tablet  Has the patient contacted their pharmacy? Yes (Agent: If no, request that the patient contact the pharmacy for the refill. If patient does not wish to contact the pharmacy document the reason why and proceed with request.) (Agent: If yes, when and what did the pharmacy advise?)Pharmacy need script.   This is the patient's preferred pharmacy:   [facility] - [place], [place] - [address] [place] [address] Phone: [phone] Fax: [phone]  Is this the correct pharmacy for this prescription? Yes If no, delete pharmacy and type the correct one.   Has the prescription been filled recently? Yes  Is the patient out of the medication? Yes  Has the patient been seen for an appointment in the last year OR does the patient have an upcoming appointment? Yes  Can we respond through MyChart? Yes  Agent: Please be advised that Rx refills may take up to 3 business days. We ask that you follow-up with your pharmacy.

## 2023-10-07 ENCOUNTER — Other Ambulatory Visit: Payer: Self-pay | Admitting: Physician Assistant

## 2023-10-07 LAB — COMPREHENSIVE METABOLIC PANEL WITH GFR
ALT: 15 IU/L (ref 0–32)
AST: 20 IU/L (ref 0–40)
Albumin: 4.5 g/dL (ref 3.9–4.9)
Alkaline Phosphatase: 79 IU/L (ref 41–116)
BUN/Creatinine Ratio: 13 (ref 9–23)
BUN: 11 mg/dL (ref 6–24)
Bilirubin Total: 0.5 mg/dL (ref 0.0–1.2)
CO2: 21 mmol/L (ref 20–29)
Calcium: 10.1 mg/dL (ref 8.7–10.2)
Chloride: 104 mmol/L (ref 96–106)
Creatinine, Ser: 0.86 mg/dL (ref 0.57–1.00)
Globulin, Total: 2.4 g/dL (ref 1.5–4.5)
Glucose: 86 mg/dL (ref 70–99)
Potassium: 3.4 mmol/L — ABNORMAL LOW (ref 3.5–5.2)
Sodium: 145 mmol/L — ABNORMAL HIGH (ref 134–144)
Total Protein: 6.9 g/dL (ref 6.0–8.5)
eGFR: 84 mL/min/1.73 (ref >59)

## 2023-10-07 LAB — ALPHA-GAL PANEL
Allergen Lamb IgE: <0.1 kU/L
Beef IgE: <0.1 kU/L
IgE (Immunoglobulin E), Serum: 57 [IU]/mL (ref 6–495)
O215-IgE Alpha-Gal: <0.1 kU/L
Pork IgE: <0.1 kU/L

## 2023-10-07 LAB — CBC WITH DIFFERENTIAL/PLATELET
Basophils Absolute: 0 x10E3/uL (ref 0.0–0.2)
Basos: 0 %
EOS (ABSOLUTE): 0 x10E3/uL (ref 0.0–0.4)
Eos: 0 %
Hematocrit: 44.5 % (ref 34.0–46.6)
Hemoglobin: 14.3 g/dL (ref 11.1–15.9)
Immature Grans (Abs): 0.1 x10E3/uL (ref 0.0–0.1)
Immature Granulocytes: 1 %
Lymphocytes Absolute: 1.3 x10E3/uL (ref 0.7–3.1)
Lymphs: 10 %
MCH: 30 pg (ref 26.6–33.0)
MCHC: 32.1 g/dL (ref 31.5–35.7)
MCV: 93 fL (ref 79–97)
Monocytes Absolute: 0.6 x10E3/uL (ref 0.1–0.9)
Monocytes: 5 %
Neutrophils Absolute: 11 x10E3/uL — ABNORMAL HIGH (ref 1.4–7.0)
Neutrophils: 84 %
Platelets: 215 x10E3/uL (ref 150–450)
RBC: 4.77 x10E6/uL (ref 3.77–5.28)
RDW: 12.6 % (ref 11.7–15.4)
WBC: 13.1 x10E3/uL — ABNORMAL HIGH (ref 3.4–10.8)

## 2023-10-08 ENCOUNTER — Ambulatory Visit: Payer: Self-pay | Admitting: Physician Assistant

## 2023-10-08 ENCOUNTER — Other Ambulatory Visit: Payer: Self-pay | Admitting: Physician Assistant

## 2023-10-08 MED ORDER — HYDROXYZINE PAMOATE 25 MG PO CAPS: 25.0000 mg | ORAL_CAPSULE | Freq: Three times a day (TID) | ORAL | 1 refills | Status: AC | PRN

## 2023-10-08 NOTE — Telephone Encounter (Signed)
 Please advice  Copied from CRM [id number]. Topic: Clinical - Medication Question >> Oct 08, 2023  9:29 AM Ashley Porter wrote: Reason for CRM: [facility] this morning and said Ashley Porter was supposed to send in a prescription for Hydroxyzine  to their pharmacy but they have not seen anything yet.  I did not see that conversation in her chart.

## 2023-12-21 ENCOUNTER — Other Ambulatory Visit: Payer: Self-pay | Admitting: Physician Assistant

## 2023-12-21 DIAGNOSIS — Z1231 Encounter for screening mammogram for malignant neoplasm of breast: Secondary | ICD-10-CM

## 2023-12-30 ENCOUNTER — Encounter: Payer: Self-pay | Admitting: Physician Assistant

## 2024-01-21 ENCOUNTER — Ambulatory Visit
Admission: RE | Admit: 2024-01-21 | Discharge: 2024-01-21 | Disposition: A | Discharge status: Home / Self Care | Source: Ambulatory Visit | Attending: Physician Assistant | Admitting: Physician Assistant

## 2024-01-21 DIAGNOSIS — Z1231 Encounter for screening mammogram for malignant neoplasm of breast: Secondary | ICD-10-CM

## 2024-01-24 ENCOUNTER — Encounter: Payer: Self-pay | Admitting: Physician Assistant
